# Patient Record
Sex: Male | Born: 1950
Health system: Southern US, Community
[De-identification: ages and names within clinical notes are randomized; demographics above are authoritative.]

## PROBLEM LIST (undated history)

## (undated) ENCOUNTER — Emergency Department (HOSPITAL_COMMUNITY): Admission: EM | Disposition: A | Discharge status: Home / Self Care | Payer: Medicare HMO | Source: Home / Self Care

## (undated) DIAGNOSIS — R011 Cardiac murmur, unspecified: Secondary | ICD-10-CM

## (undated) DIAGNOSIS — E1169 Type 2 diabetes mellitus with other specified complication: Secondary | ICD-10-CM

## (undated) DIAGNOSIS — Z6824 Body mass index (BMI) 24.0-24.9, adult: Secondary | ICD-10-CM

## (undated) DIAGNOSIS — N189 Chronic kidney disease, unspecified: Secondary | ICD-10-CM

## (undated) DIAGNOSIS — I639 Cerebral infarction, unspecified: Secondary | ICD-10-CM

## (undated) DIAGNOSIS — E785 Hyperlipidemia, unspecified: Secondary | ICD-10-CM

## (undated) DIAGNOSIS — R0902 Hypoxemia: Secondary | ICD-10-CM

## (undated) DIAGNOSIS — E119 Type 2 diabetes mellitus without complications: Secondary | ICD-10-CM

## (undated) DIAGNOSIS — I10 Essential (primary) hypertension: Secondary | ICD-10-CM

## (undated) HISTORY — DX: Hyperlipidemia, unspecified: E78.5

## (undated) HISTORY — DX: Body mass index (BMI) 24.0-24.9, adult: Z68.24

## (undated) HISTORY — DX: Hypoxemia: R09.02

## (undated) HISTORY — PX: SHOULDER ARTHROSCOPY: SHX128

## (undated) HISTORY — DX: Type 2 diabetes mellitus with other specified complication: E11.69

---

## 2004-07-04 ENCOUNTER — Ambulatory Visit: Payer: Self-pay | Admitting: Family Medicine

## 2006-02-17 ENCOUNTER — Ambulatory Visit: Payer: Self-pay | Admitting: Gastroenterology

## 2006-05-14 IMAGING — US ABDOMEN ULTRASOUND
1 series · 17 of 25 positions shown · non-contrast
Comparison: none

REASON FOR EXAM: RUQ pain
COMMENTS:

[Series 1: abdomen ultrasound · 17 of 74 slices shown]
[im 1/74]
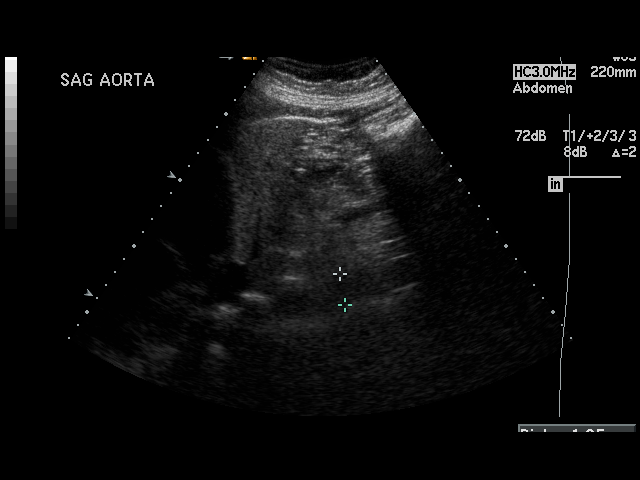
[im 7/74]
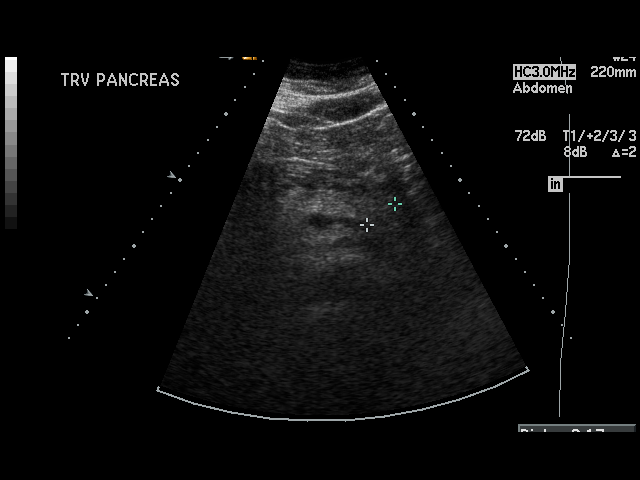
[im 10/74]
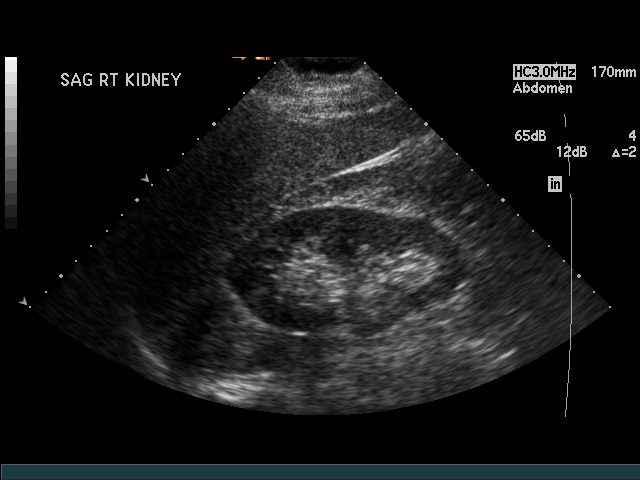
[im 16/74]
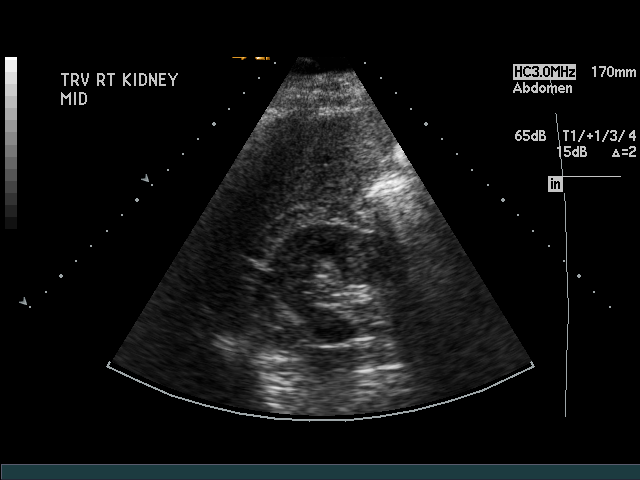
[im 19/74]
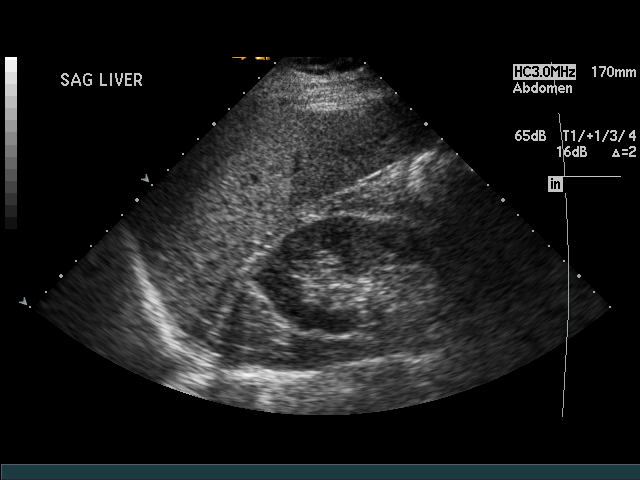
[im 25/74]
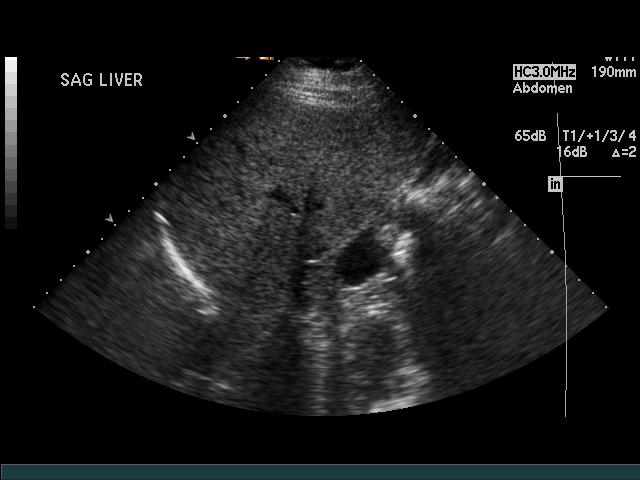
[im 28/74]
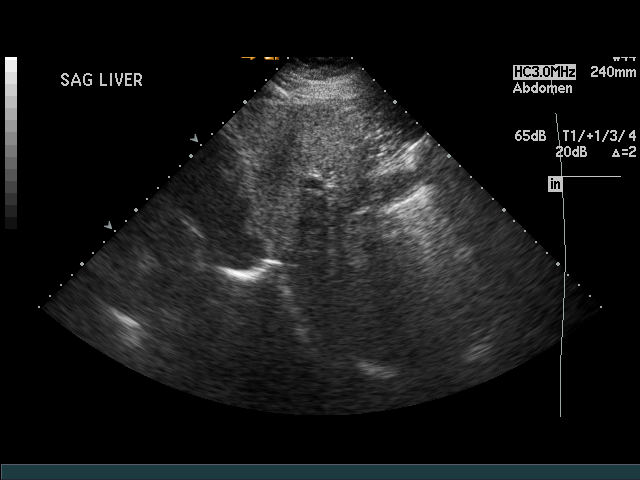
[im 34/74]
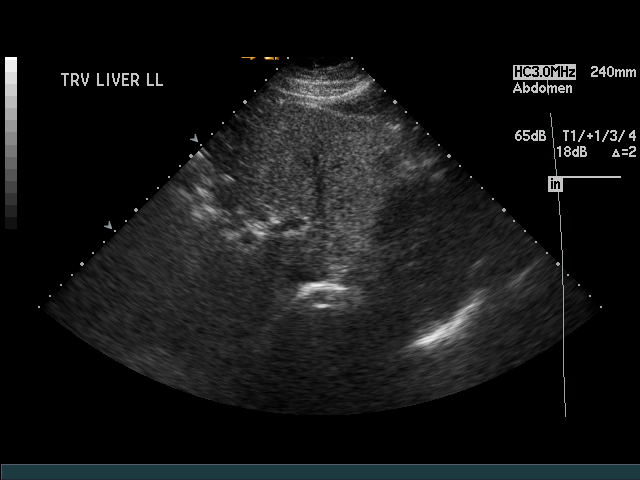
[im 37/74]
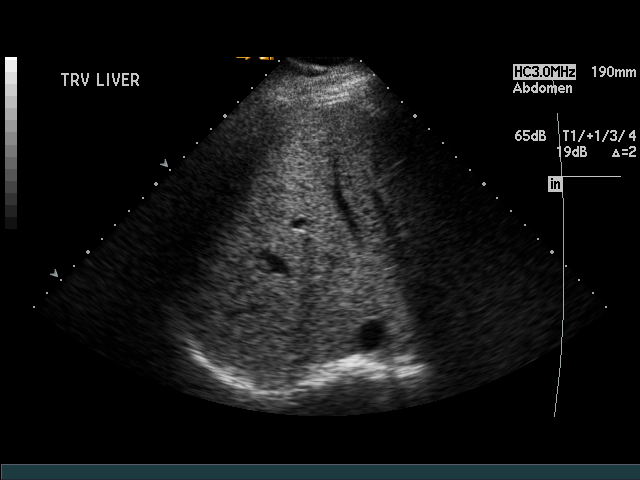
[im 40/74]
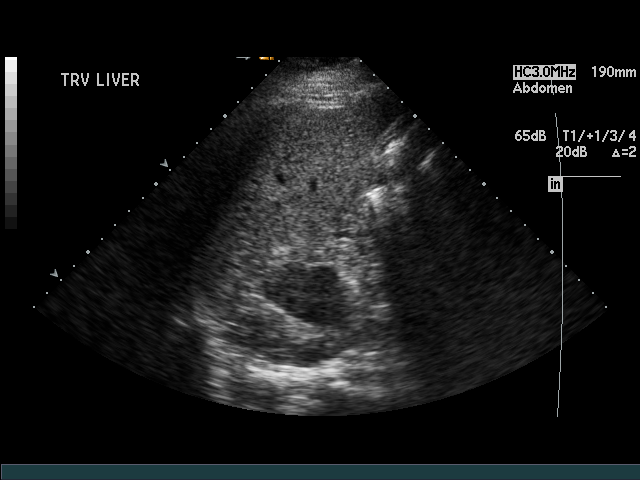
[im 46/74]
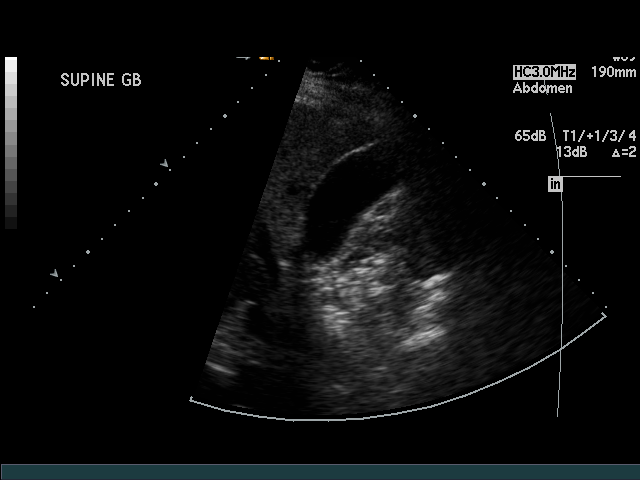
[im 49/74]
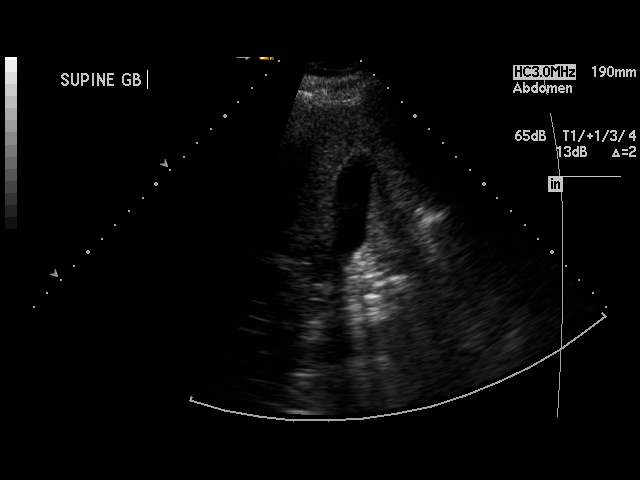
[im 55/74]
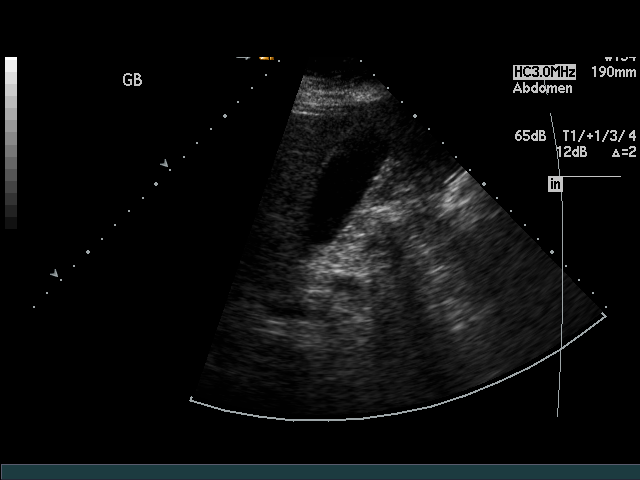
[im 58/74]
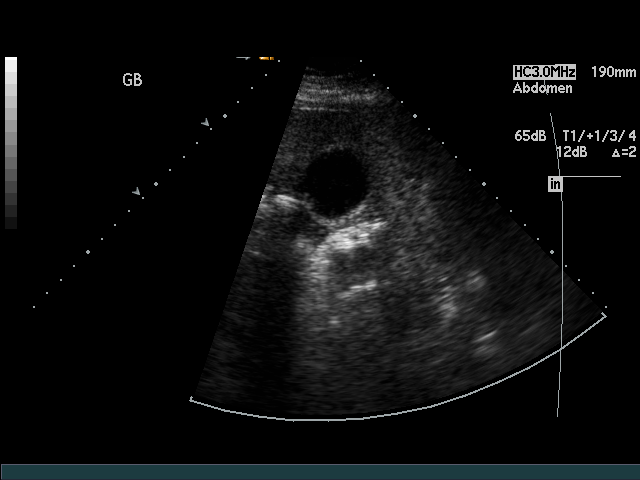
[im 64/74]
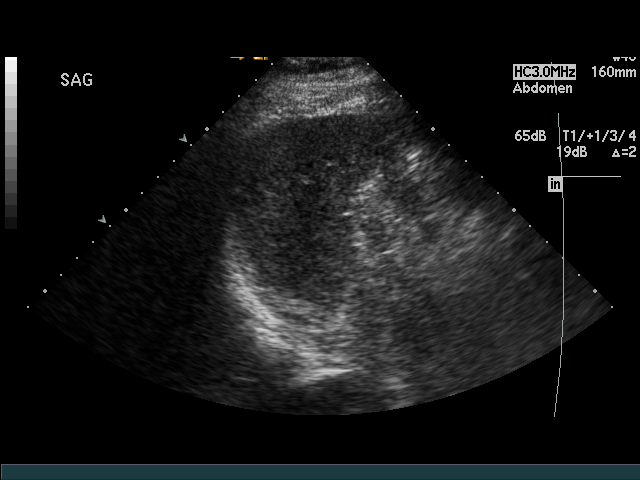
[im 67/74]
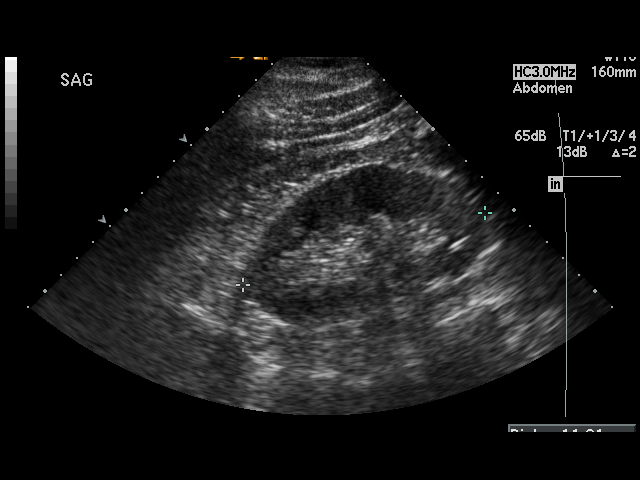
[im 74/74]
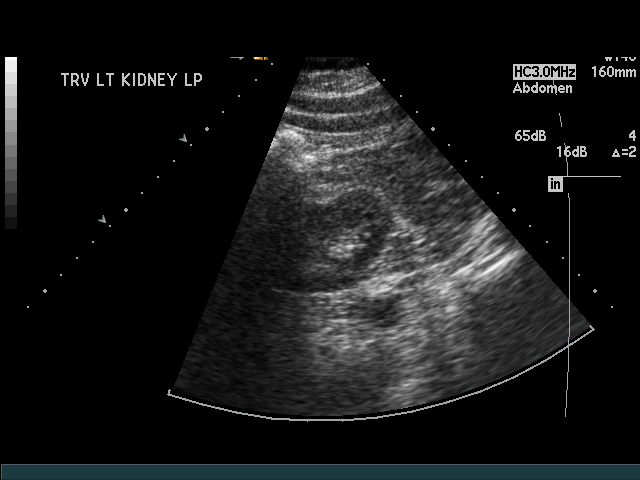

[17 of 25 positions shown; findings below may reference images not displayed]

PROCEDURE:     US  - US ABDOMEN GENERAL SURVEY  - July 04, 2004  [DATE]

RESULT:       Sonographic evaluation of the abdomen is performed in the
standard fashion.  The liver suggest slightly increased echotexture which
may be secondary to mild diffuse fatty infiltration.  The gallbladder is
fluid filled with a common bile duct diameter of 4 mm.  The pancreas is only
partially distributed.  The spleen, kidneys and liver are otherwise normal.
There is no pericholecystic fluid or ascites evident.
IMPRESSION: 1.     Portions of the pancreas were obscured by overlying bowel gas.
2.     No evidence of cholelithiasis.
3.     Otherwise unremarkable abdominal sonogram with mild increased
echogenicity of the liver which could represent diffuse fatty infiltration.

## 2006-11-11 ENCOUNTER — Ambulatory Visit: Payer: Self-pay | Admitting: Family Medicine

## 2006-11-27 ENCOUNTER — Ambulatory Visit: Payer: Self-pay | Admitting: Family Medicine

## 2008-09-20 IMAGING — US US CAROTID DUPLEX BILAT
1 series · 17 of 24 positions shown · non-contrast
Comparison: none

REASON FOR EXAM: TIA      heart murmur
COMMENTS:

[Series 1: us carotid duplex bilat · 17 of 56 slices shown]
[im 1/56]
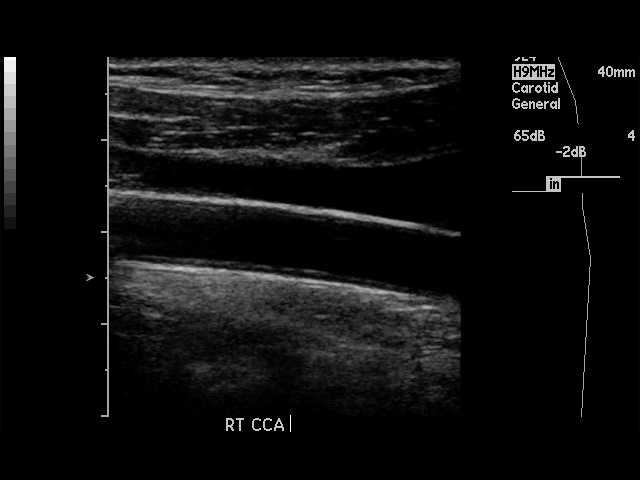
[im 5/56]
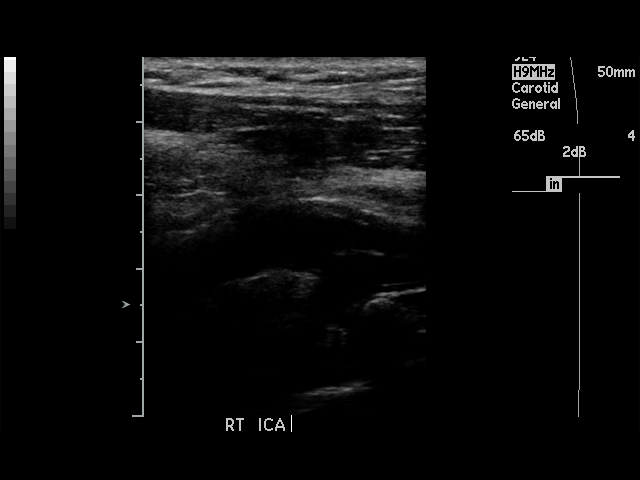
[im 8/56]
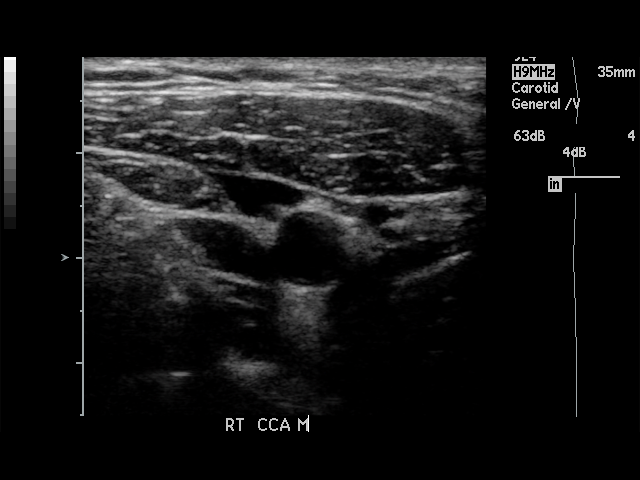
[im 10/56]
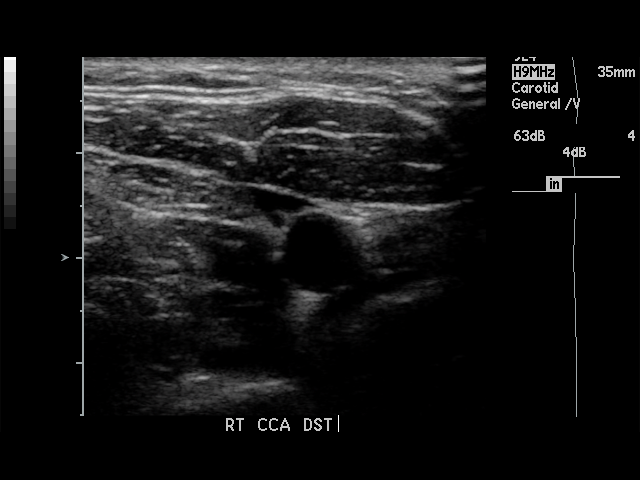
[im 15/56]
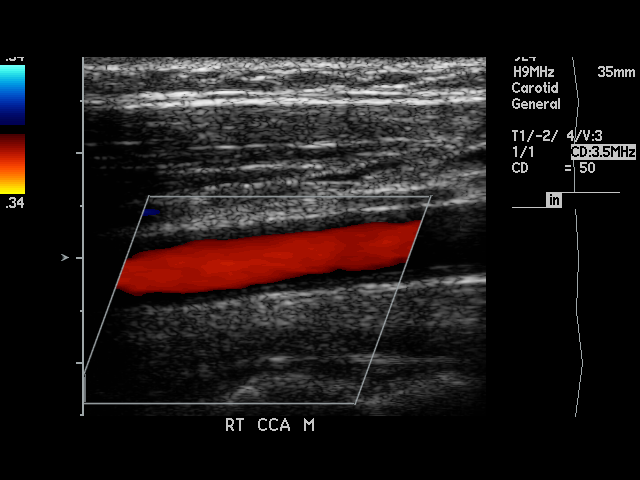
[im 17/56]
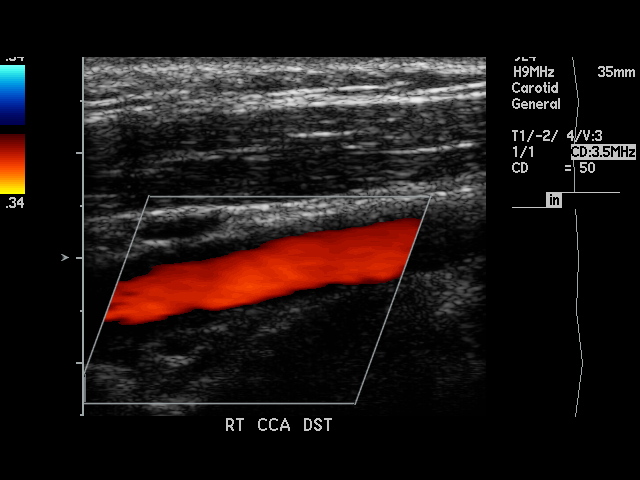
[im 22/56]
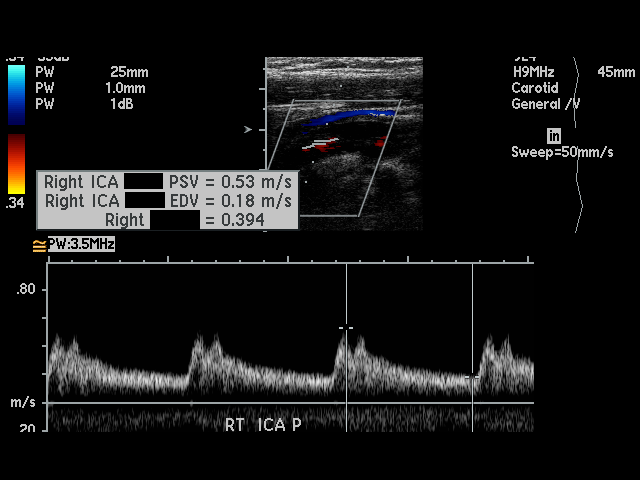
[im 24/56]
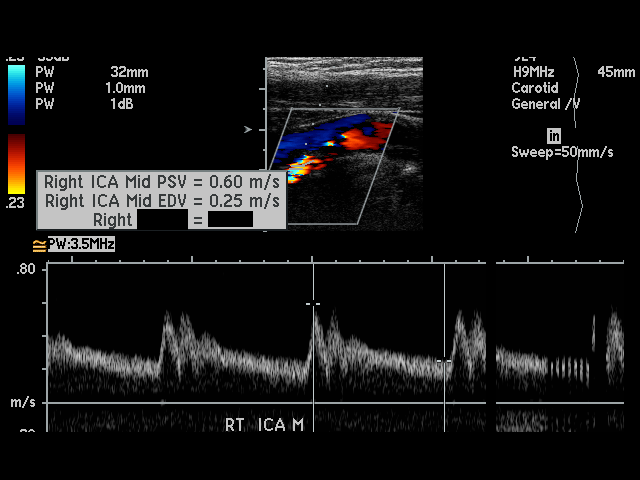
[im 29/56]
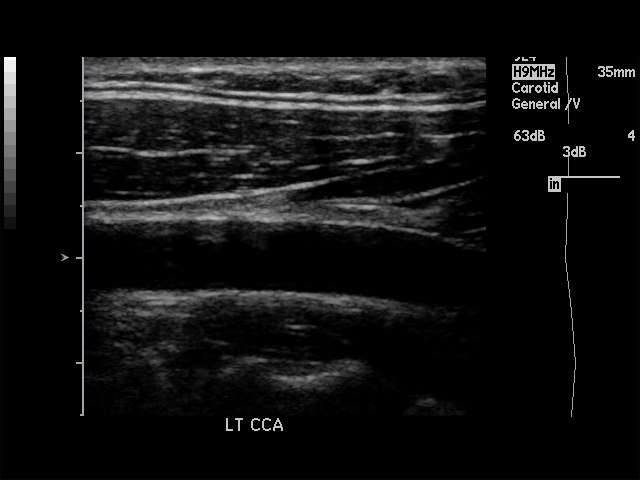
[im 32/56]
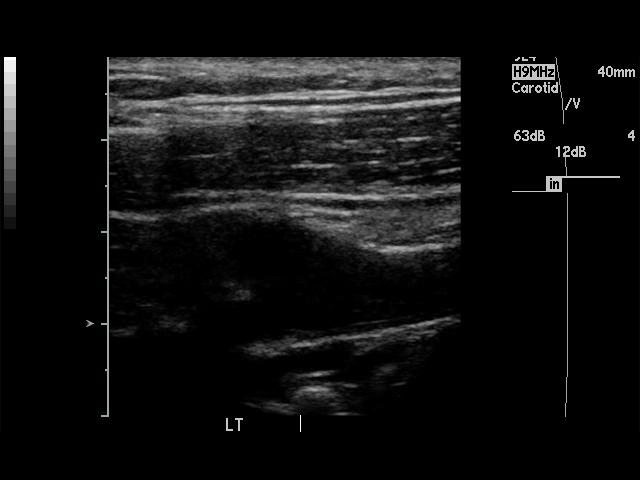
[im 34/56]
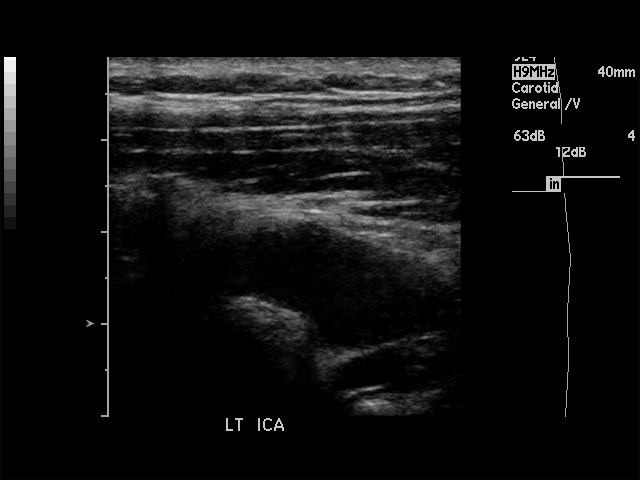
[im 39/56]
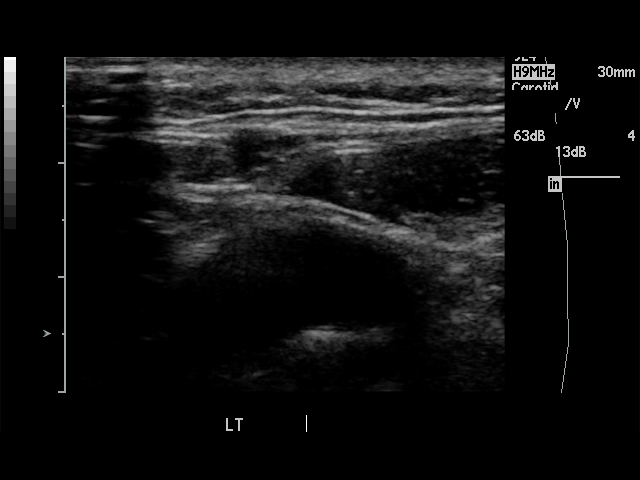
[im 41/56]
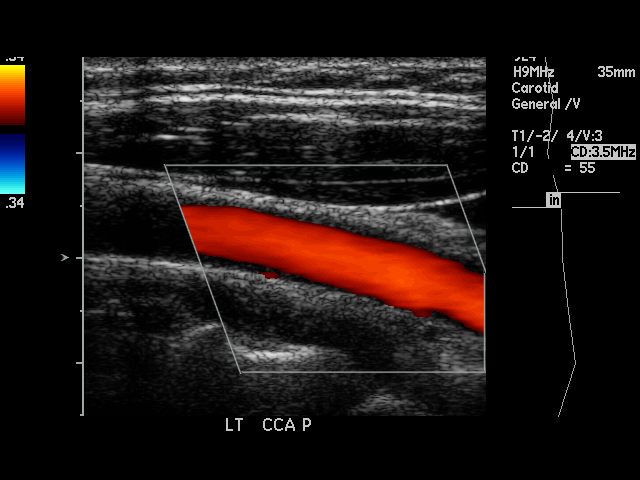
[im 46/56]
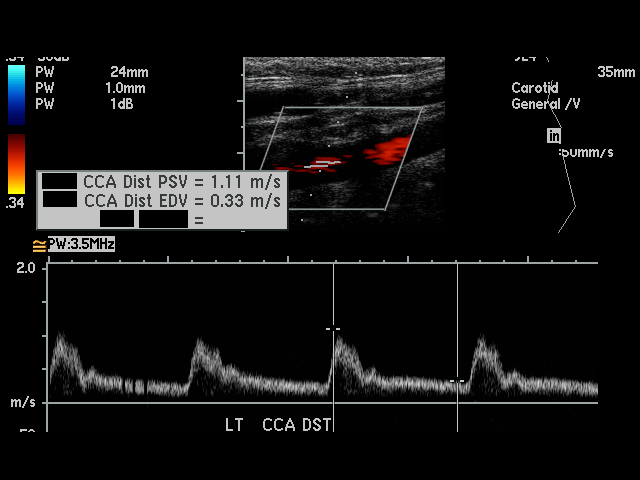
[im 48/56]
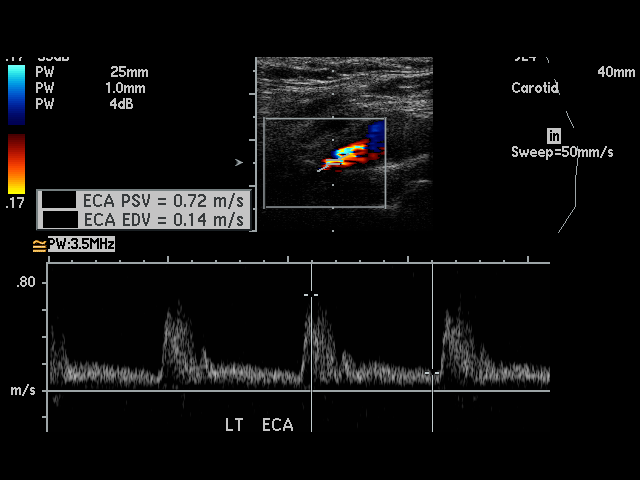
[im 51/56]
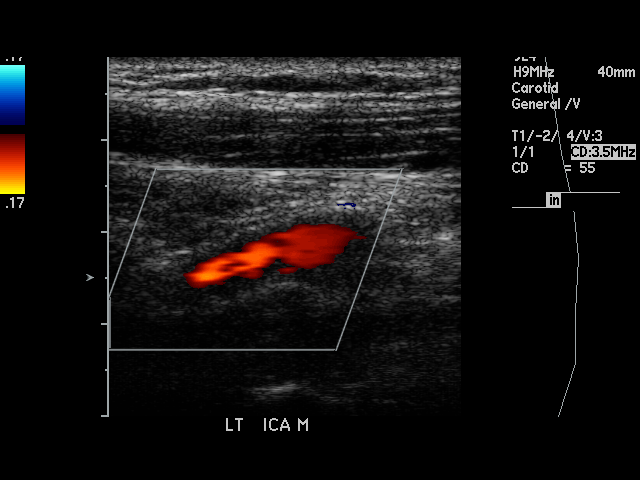
[im 56/56]
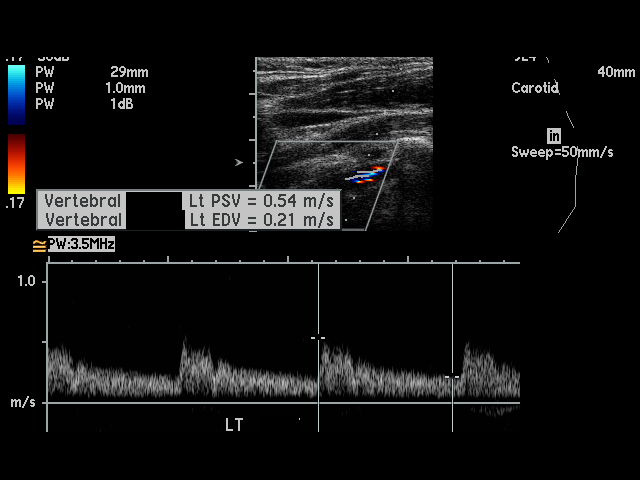

[17 of 24 positions shown; findings below may reference images not displayed]

PROCEDURE:     US  - US CAROTID DOPPLER BILATERAL  - November 11, 2006 [DATE]

RESULT:     No definite soft or calcific plaque formation is identified on
either side. On the RIGHT, the peak RIGHT common carotid artery flow
velocity measures 1.35 meters per second and the peak RIGHT internal carotid
artery flow velocity measures .60 meters per second. The IC/CC ratio is
0.44.  On the LEFT, the peak LEFT common carotid artery flow velocity
measures 1.32 meters per second and the peak LEFT internal carotid artery
flow velocity measures .82 meters per second. The IC/CC ratio is 0.61.
These values are compatible with the absence of hemodynamically significant
stenosis bilaterally.

Antegrade flow is observed in both vertebrals.
IMPRESSION: 1. Normal study. No plaque formation or stenosis is identified on either
side.
2. Antegrade flow is noted in both vertebrals.

## 2011-04-19 DIAGNOSIS — I639 Cerebral infarction, unspecified: Secondary | ICD-10-CM

## 2011-04-19 HISTORY — DX: Cerebral infarction, unspecified: I63.9

## 2014-09-13 DIAGNOSIS — E782 Mixed hyperlipidemia: Secondary | ICD-10-CM | POA: Diagnosis not present

## 2014-09-13 DIAGNOSIS — E1129 Type 2 diabetes mellitus with other diabetic kidney complication: Secondary | ICD-10-CM | POA: Diagnosis not present

## 2014-09-13 DIAGNOSIS — E1159 Type 2 diabetes mellitus with other circulatory complications: Secondary | ICD-10-CM | POA: Diagnosis not present

## 2014-09-15 DIAGNOSIS — E1159 Type 2 diabetes mellitus with other circulatory complications: Secondary | ICD-10-CM | POA: Diagnosis not present

## 2014-09-15 DIAGNOSIS — N182 Chronic kidney disease, stage 2 (mild): Secondary | ICD-10-CM | POA: Diagnosis not present

## 2014-09-15 DIAGNOSIS — I10 Essential (primary) hypertension: Secondary | ICD-10-CM | POA: Diagnosis not present

## 2014-09-15 DIAGNOSIS — E1129 Type 2 diabetes mellitus with other diabetic kidney complication: Secondary | ICD-10-CM | POA: Diagnosis not present

## 2014-10-06 DIAGNOSIS — H52223 Regular astigmatism, bilateral: Secondary | ICD-10-CM | POA: Diagnosis not present

## 2014-10-06 DIAGNOSIS — E11329 Type 2 diabetes mellitus with mild nonproliferative diabetic retinopathy without macular edema: Secondary | ICD-10-CM | POA: Diagnosis not present

## 2014-10-06 DIAGNOSIS — H5203 Hypermetropia, bilateral: Secondary | ICD-10-CM | POA: Diagnosis not present

## 2015-01-18 DIAGNOSIS — E1159 Type 2 diabetes mellitus with other circulatory complications: Secondary | ICD-10-CM | POA: Diagnosis not present

## 2015-01-18 DIAGNOSIS — E1129 Type 2 diabetes mellitus with other diabetic kidney complication: Secondary | ICD-10-CM | POA: Diagnosis not present

## 2015-01-18 DIAGNOSIS — E782 Mixed hyperlipidemia: Secondary | ICD-10-CM | POA: Diagnosis not present

## 2015-01-31 DIAGNOSIS — I10 Essential (primary) hypertension: Secondary | ICD-10-CM | POA: Diagnosis not present

## 2015-01-31 DIAGNOSIS — E1165 Type 2 diabetes mellitus with hyperglycemia: Secondary | ICD-10-CM | POA: Diagnosis not present

## 2015-01-31 DIAGNOSIS — E782 Mixed hyperlipidemia: Secondary | ICD-10-CM | POA: Diagnosis not present

## 2015-01-31 DIAGNOSIS — E1159 Type 2 diabetes mellitus with other circulatory complications: Secondary | ICD-10-CM | POA: Diagnosis not present

## 2015-02-16 DIAGNOSIS — Z0100 Encounter for examination of eyes and vision without abnormal findings: Secondary | ICD-10-CM | POA: Diagnosis not present

## 2015-02-16 DIAGNOSIS — Z0000 Encounter for general adult medical examination without abnormal findings: Secondary | ICD-10-CM | POA: Diagnosis not present

## 2015-02-16 DIAGNOSIS — Z139 Encounter for screening, unspecified: Secondary | ICD-10-CM | POA: Diagnosis not present

## 2015-02-16 DIAGNOSIS — Z23 Encounter for immunization: Secondary | ICD-10-CM | POA: Diagnosis not present

## 2015-02-16 DIAGNOSIS — Z1389 Encounter for screening for other disorder: Secondary | ICD-10-CM | POA: Diagnosis not present

## 2015-04-25 DIAGNOSIS — Z125 Encounter for screening for malignant neoplasm of prostate: Secondary | ICD-10-CM | POA: Diagnosis not present

## 2015-04-25 DIAGNOSIS — E785 Hyperlipidemia, unspecified: Secondary | ICD-10-CM | POA: Diagnosis not present

## 2015-04-25 DIAGNOSIS — E1159 Type 2 diabetes mellitus with other circulatory complications: Secondary | ICD-10-CM | POA: Diagnosis not present

## 2015-04-25 DIAGNOSIS — E1165 Type 2 diabetes mellitus with hyperglycemia: Secondary | ICD-10-CM | POA: Diagnosis not present

## 2015-05-02 DIAGNOSIS — E785 Hyperlipidemia, unspecified: Secondary | ICD-10-CM | POA: Diagnosis not present

## 2015-05-02 DIAGNOSIS — E1159 Type 2 diabetes mellitus with other circulatory complications: Secondary | ICD-10-CM | POA: Diagnosis not present

## 2015-05-02 DIAGNOSIS — E1165 Type 2 diabetes mellitus with hyperglycemia: Secondary | ICD-10-CM | POA: Diagnosis not present

## 2015-05-02 DIAGNOSIS — I10 Essential (primary) hypertension: Secondary | ICD-10-CM | POA: Diagnosis not present

## 2015-08-29 DIAGNOSIS — E1165 Type 2 diabetes mellitus with hyperglycemia: Secondary | ICD-10-CM | POA: Diagnosis not present

## 2015-08-29 DIAGNOSIS — E785 Hyperlipidemia, unspecified: Secondary | ICD-10-CM | POA: Diagnosis not present

## 2015-08-29 DIAGNOSIS — E1159 Type 2 diabetes mellitus with other circulatory complications: Secondary | ICD-10-CM | POA: Diagnosis not present

## 2015-09-14 DIAGNOSIS — E119 Type 2 diabetes mellitus without complications: Secondary | ICD-10-CM | POA: Diagnosis not present

## 2015-09-14 DIAGNOSIS — I10 Essential (primary) hypertension: Secondary | ICD-10-CM | POA: Diagnosis not present

## 2015-09-14 DIAGNOSIS — E785 Hyperlipidemia, unspecified: Secondary | ICD-10-CM | POA: Diagnosis not present

## 2015-09-14 DIAGNOSIS — E1159 Type 2 diabetes mellitus with other circulatory complications: Secondary | ICD-10-CM | POA: Diagnosis not present

## 2015-10-10 DIAGNOSIS — E113293 Type 2 diabetes mellitus with mild nonproliferative diabetic retinopathy without macular edema, bilateral: Secondary | ICD-10-CM | POA: Diagnosis not present

## 2015-10-10 DIAGNOSIS — H25813 Combined forms of age-related cataract, bilateral: Secondary | ICD-10-CM | POA: Diagnosis not present

## 2015-10-10 DIAGNOSIS — H11153 Pinguecula, bilateral: Secondary | ICD-10-CM | POA: Diagnosis not present

## 2016-01-23 DIAGNOSIS — E785 Hyperlipidemia, unspecified: Secondary | ICD-10-CM | POA: Diagnosis not present

## 2016-01-23 DIAGNOSIS — E1165 Type 2 diabetes mellitus with hyperglycemia: Secondary | ICD-10-CM | POA: Diagnosis not present

## 2016-01-23 DIAGNOSIS — E1159 Type 2 diabetes mellitus with other circulatory complications: Secondary | ICD-10-CM | POA: Diagnosis not present

## 2016-02-12 DIAGNOSIS — Z23 Encounter for immunization: Secondary | ICD-10-CM | POA: Diagnosis not present

## 2016-02-12 DIAGNOSIS — E785 Hyperlipidemia, unspecified: Secondary | ICD-10-CM | POA: Diagnosis not present

## 2016-02-12 DIAGNOSIS — E1159 Type 2 diabetes mellitus with other circulatory complications: Secondary | ICD-10-CM | POA: Diagnosis not present

## 2016-02-12 DIAGNOSIS — E1129 Type 2 diabetes mellitus with other diabetic kidney complication: Secondary | ICD-10-CM | POA: Diagnosis not present

## 2016-02-12 DIAGNOSIS — I10 Essential (primary) hypertension: Secondary | ICD-10-CM | POA: Diagnosis not present

## 2016-04-16 DIAGNOSIS — Z6829 Body mass index (BMI) 29.0-29.9, adult: Secondary | ICD-10-CM | POA: Diagnosis not present

## 2016-04-16 DIAGNOSIS — M7989 Other specified soft tissue disorders: Secondary | ICD-10-CM | POA: Diagnosis not present

## 2016-04-16 DIAGNOSIS — M25571 Pain in right ankle and joints of right foot: Secondary | ICD-10-CM | POA: Diagnosis not present

## 2016-04-16 DIAGNOSIS — M79661 Pain in right lower leg: Secondary | ICD-10-CM | POA: Diagnosis not present

## 2016-04-23 DIAGNOSIS — Z0000 Encounter for general adult medical examination without abnormal findings: Secondary | ICD-10-CM | POA: Diagnosis not present

## 2016-04-23 DIAGNOSIS — M25571 Pain in right ankle and joints of right foot: Secondary | ICD-10-CM | POA: Diagnosis not present

## 2016-04-23 DIAGNOSIS — Z139 Encounter for screening, unspecified: Secondary | ICD-10-CM | POA: Diagnosis not present

## 2016-04-23 DIAGNOSIS — Z1389 Encounter for screening for other disorder: Secondary | ICD-10-CM | POA: Diagnosis not present

## 2016-04-24 DIAGNOSIS — M25571 Pain in right ankle and joints of right foot: Secondary | ICD-10-CM | POA: Diagnosis not present

## 2016-05-14 DIAGNOSIS — M25571 Pain in right ankle and joints of right foot: Secondary | ICD-10-CM | POA: Diagnosis not present

## 2016-05-14 DIAGNOSIS — Z6826 Body mass index (BMI) 26.0-26.9, adult: Secondary | ICD-10-CM | POA: Diagnosis not present

## 2016-05-14 DIAGNOSIS — M25562 Pain in left knee: Secondary | ICD-10-CM | POA: Diagnosis not present

## 2016-05-16 DIAGNOSIS — M25562 Pain in left knee: Secondary | ICD-10-CM | POA: Diagnosis not present

## 2016-05-16 DIAGNOSIS — M1712 Unilateral primary osteoarthritis, left knee: Secondary | ICD-10-CM | POA: Diagnosis not present

## 2016-05-16 DIAGNOSIS — M25462 Effusion, left knee: Secondary | ICD-10-CM | POA: Diagnosis not present

## 2016-05-17 DIAGNOSIS — M25462 Effusion, left knee: Secondary | ICD-10-CM | POA: Diagnosis not present

## 2016-05-28 DIAGNOSIS — E1129 Type 2 diabetes mellitus with other diabetic kidney complication: Secondary | ICD-10-CM | POA: Diagnosis not present

## 2016-05-28 DIAGNOSIS — E785 Hyperlipidemia, unspecified: Secondary | ICD-10-CM | POA: Diagnosis not present

## 2016-05-28 DIAGNOSIS — E1159 Type 2 diabetes mellitus with other circulatory complications: Secondary | ICD-10-CM | POA: Diagnosis not present

## 2016-06-11 DIAGNOSIS — E1159 Type 2 diabetes mellitus with other circulatory complications: Secondary | ICD-10-CM | POA: Diagnosis not present

## 2016-06-11 DIAGNOSIS — E1129 Type 2 diabetes mellitus with other diabetic kidney complication: Secondary | ICD-10-CM | POA: Diagnosis not present

## 2016-06-11 DIAGNOSIS — E782 Mixed hyperlipidemia: Secondary | ICD-10-CM | POA: Diagnosis not present

## 2016-06-11 DIAGNOSIS — I10 Essential (primary) hypertension: Secondary | ICD-10-CM | POA: Diagnosis not present

## 2016-06-20 DIAGNOSIS — M109 Gout, unspecified: Secondary | ICD-10-CM | POA: Diagnosis not present

## 2016-09-17 DIAGNOSIS — E1159 Type 2 diabetes mellitus with other circulatory complications: Secondary | ICD-10-CM | POA: Diagnosis not present

## 2016-09-17 DIAGNOSIS — E785 Hyperlipidemia, unspecified: Secondary | ICD-10-CM | POA: Diagnosis not present

## 2016-09-17 DIAGNOSIS — E1129 Type 2 diabetes mellitus with other diabetic kidney complication: Secondary | ICD-10-CM | POA: Diagnosis not present

## 2016-09-30 DIAGNOSIS — E785 Hyperlipidemia, unspecified: Secondary | ICD-10-CM | POA: Diagnosis not present

## 2016-09-30 DIAGNOSIS — E1129 Type 2 diabetes mellitus with other diabetic kidney complication: Secondary | ICD-10-CM | POA: Diagnosis not present

## 2016-09-30 DIAGNOSIS — E1159 Type 2 diabetes mellitus with other circulatory complications: Secondary | ICD-10-CM | POA: Diagnosis not present

## 2016-09-30 DIAGNOSIS — I10 Essential (primary) hypertension: Secondary | ICD-10-CM | POA: Diagnosis not present

## 2016-10-16 DIAGNOSIS — M109 Gout, unspecified: Secondary | ICD-10-CM | POA: Diagnosis not present

## 2016-10-16 DIAGNOSIS — Z6825 Body mass index (BMI) 25.0-25.9, adult: Secondary | ICD-10-CM | POA: Diagnosis not present

## 2017-01-24 DIAGNOSIS — E1159 Type 2 diabetes mellitus with other circulatory complications: Secondary | ICD-10-CM | POA: Diagnosis not present

## 2017-01-24 DIAGNOSIS — E1129 Type 2 diabetes mellitus with other diabetic kidney complication: Secondary | ICD-10-CM | POA: Diagnosis not present

## 2017-01-24 DIAGNOSIS — E785 Hyperlipidemia, unspecified: Secondary | ICD-10-CM | POA: Diagnosis not present

## 2017-01-30 DIAGNOSIS — E113293 Type 2 diabetes mellitus with mild nonproliferative diabetic retinopathy without macular edema, bilateral: Secondary | ICD-10-CM | POA: Diagnosis not present

## 2017-01-30 DIAGNOSIS — Z0100 Encounter for examination of eyes and vision without abnormal findings: Secondary | ICD-10-CM | POA: Diagnosis not present

## 2017-01-30 DIAGNOSIS — H25813 Combined forms of age-related cataract, bilateral: Secondary | ICD-10-CM | POA: Diagnosis not present

## 2017-01-31 DIAGNOSIS — E1129 Type 2 diabetes mellitus with other diabetic kidney complication: Secondary | ICD-10-CM | POA: Diagnosis not present

## 2017-01-31 DIAGNOSIS — Z139 Encounter for screening, unspecified: Secondary | ICD-10-CM | POA: Diagnosis not present

## 2017-01-31 DIAGNOSIS — Z23 Encounter for immunization: Secondary | ICD-10-CM | POA: Diagnosis not present

## 2017-01-31 DIAGNOSIS — E785 Hyperlipidemia, unspecified: Secondary | ICD-10-CM | POA: Diagnosis not present

## 2017-01-31 DIAGNOSIS — I10 Essential (primary) hypertension: Secondary | ICD-10-CM | POA: Diagnosis not present

## 2017-01-31 DIAGNOSIS — E1159 Type 2 diabetes mellitus with other circulatory complications: Secondary | ICD-10-CM | POA: Diagnosis not present

## 2017-04-22 DIAGNOSIS — Z1331 Encounter for screening for depression: Secondary | ICD-10-CM | POA: Diagnosis not present

## 2017-04-22 DIAGNOSIS — Z9181 History of falling: Secondary | ICD-10-CM | POA: Diagnosis not present

## 2017-04-22 DIAGNOSIS — Z139 Encounter for screening, unspecified: Secondary | ICD-10-CM | POA: Diagnosis not present

## 2017-04-22 DIAGNOSIS — Z0000 Encounter for general adult medical examination without abnormal findings: Secondary | ICD-10-CM | POA: Diagnosis not present

## 2017-05-27 DIAGNOSIS — E1159 Type 2 diabetes mellitus with other circulatory complications: Secondary | ICD-10-CM | POA: Diagnosis not present

## 2017-05-27 DIAGNOSIS — E785 Hyperlipidemia, unspecified: Secondary | ICD-10-CM | POA: Diagnosis not present

## 2017-05-27 DIAGNOSIS — E1129 Type 2 diabetes mellitus with other diabetic kidney complication: Secondary | ICD-10-CM | POA: Diagnosis not present

## 2017-06-04 DIAGNOSIS — I10 Essential (primary) hypertension: Secondary | ICD-10-CM | POA: Diagnosis not present

## 2017-06-04 DIAGNOSIS — E1159 Type 2 diabetes mellitus with other circulatory complications: Secondary | ICD-10-CM | POA: Diagnosis not present

## 2017-06-04 DIAGNOSIS — E1129 Type 2 diabetes mellitus with other diabetic kidney complication: Secondary | ICD-10-CM | POA: Diagnosis not present

## 2017-06-04 DIAGNOSIS — E785 Hyperlipidemia, unspecified: Secondary | ICD-10-CM | POA: Diagnosis not present

## 2017-06-23 DIAGNOSIS — Z1211 Encounter for screening for malignant neoplasm of colon: Secondary | ICD-10-CM | POA: Diagnosis not present

## 2017-06-23 DIAGNOSIS — K648 Other hemorrhoids: Secondary | ICD-10-CM | POA: Diagnosis not present

## 2017-06-23 DIAGNOSIS — Z01818 Encounter for other preprocedural examination: Secondary | ICD-10-CM | POA: Diagnosis not present

## 2017-07-11 DIAGNOSIS — E119 Type 2 diabetes mellitus without complications: Secondary | ICD-10-CM | POA: Diagnosis not present

## 2017-07-11 DIAGNOSIS — Z79899 Other long term (current) drug therapy: Secondary | ICD-10-CM | POA: Diagnosis not present

## 2017-07-11 DIAGNOSIS — Z1211 Encounter for screening for malignant neoplasm of colon: Secondary | ICD-10-CM | POA: Diagnosis not present

## 2017-07-11 DIAGNOSIS — I10 Essential (primary) hypertension: Secondary | ICD-10-CM | POA: Diagnosis not present

## 2017-07-11 DIAGNOSIS — Z7984 Long term (current) use of oral hypoglycemic drugs: Secondary | ICD-10-CM | POA: Diagnosis not present

## 2017-08-20 DIAGNOSIS — Z6826 Body mass index (BMI) 26.0-26.9, adult: Secondary | ICD-10-CM | POA: Diagnosis not present

## 2017-08-20 DIAGNOSIS — B070 Plantar wart: Secondary | ICD-10-CM | POA: Diagnosis not present

## 2017-09-05 DIAGNOSIS — Z6825 Body mass index (BMI) 25.0-25.9, adult: Secondary | ICD-10-CM | POA: Diagnosis not present

## 2017-09-05 DIAGNOSIS — L259 Unspecified contact dermatitis, unspecified cause: Secondary | ICD-10-CM | POA: Diagnosis not present

## 2017-09-16 DIAGNOSIS — E1159 Type 2 diabetes mellitus with other circulatory complications: Secondary | ICD-10-CM | POA: Diagnosis not present

## 2017-09-16 DIAGNOSIS — E1129 Type 2 diabetes mellitus with other diabetic kidney complication: Secondary | ICD-10-CM | POA: Diagnosis not present

## 2017-09-16 DIAGNOSIS — E785 Hyperlipidemia, unspecified: Secondary | ICD-10-CM | POA: Diagnosis not present

## 2017-09-29 DIAGNOSIS — E785 Hyperlipidemia, unspecified: Secondary | ICD-10-CM | POA: Diagnosis not present

## 2017-09-29 DIAGNOSIS — I10 Essential (primary) hypertension: Secondary | ICD-10-CM | POA: Diagnosis not present

## 2017-09-29 DIAGNOSIS — M25512 Pain in left shoulder: Secondary | ICD-10-CM | POA: Diagnosis not present

## 2017-09-29 DIAGNOSIS — E1129 Type 2 diabetes mellitus with other diabetic kidney complication: Secondary | ICD-10-CM | POA: Diagnosis not present

## 2017-09-29 DIAGNOSIS — S4992XA Unspecified injury of left shoulder and upper arm, initial encounter: Secondary | ICD-10-CM | POA: Diagnosis not present

## 2017-10-08 DIAGNOSIS — Z139 Encounter for screening, unspecified: Secondary | ICD-10-CM | POA: Diagnosis not present

## 2017-10-08 DIAGNOSIS — M25512 Pain in left shoulder: Secondary | ICD-10-CM | POA: Diagnosis not present

## 2017-10-08 DIAGNOSIS — Z6825 Body mass index (BMI) 25.0-25.9, adult: Secondary | ICD-10-CM | POA: Diagnosis not present

## 2017-10-15 DIAGNOSIS — M7542 Impingement syndrome of left shoulder: Secondary | ICD-10-CM | POA: Diagnosis not present

## 2017-11-12 DIAGNOSIS — M7542 Impingement syndrome of left shoulder: Secondary | ICD-10-CM

## 2017-11-12 DIAGNOSIS — S46912D Strain of unspecified muscle, fascia and tendon at shoulder and upper arm level, left arm, subsequent encounter: Secondary | ICD-10-CM | POA: Diagnosis not present

## 2017-11-12 HISTORY — DX: Impingement syndrome of left shoulder: M75.42

## 2017-11-25 DIAGNOSIS — M25512 Pain in left shoulder: Secondary | ICD-10-CM | POA: Diagnosis not present

## 2017-11-28 DIAGNOSIS — S46912D Strain of unspecified muscle, fascia and tendon at shoulder and upper arm level, left arm, subsequent encounter: Secondary | ICD-10-CM | POA: Diagnosis not present

## 2017-12-03 DIAGNOSIS — Z139 Encounter for screening, unspecified: Secondary | ICD-10-CM | POA: Diagnosis not present

## 2017-12-03 DIAGNOSIS — S81819A Laceration without foreign body, unspecified lower leg, initial encounter: Secondary | ICD-10-CM | POA: Diagnosis not present

## 2017-12-03 DIAGNOSIS — Z6825 Body mass index (BMI) 25.0-25.9, adult: Secondary | ICD-10-CM | POA: Diagnosis not present

## 2017-12-03 DIAGNOSIS — T148XXA Other injury of unspecified body region, initial encounter: Secondary | ICD-10-CM | POA: Diagnosis not present

## 2017-12-23 DIAGNOSIS — M7502 Adhesive capsulitis of left shoulder: Secondary | ICD-10-CM | POA: Diagnosis not present

## 2017-12-23 DIAGNOSIS — S43432A Superior glenoid labrum lesion of left shoulder, initial encounter: Secondary | ICD-10-CM | POA: Diagnosis not present

## 2017-12-23 DIAGNOSIS — M7522 Bicipital tendinitis, left shoulder: Secondary | ICD-10-CM | POA: Diagnosis not present

## 2017-12-23 DIAGNOSIS — M24112 Other articular cartilage disorders, left shoulder: Secondary | ICD-10-CM | POA: Diagnosis not present

## 2017-12-23 DIAGNOSIS — M19012 Primary osteoarthritis, left shoulder: Secondary | ICD-10-CM | POA: Diagnosis not present

## 2017-12-23 DIAGNOSIS — G8918 Other acute postprocedural pain: Secondary | ICD-10-CM | POA: Diagnosis not present

## 2017-12-23 DIAGNOSIS — M659 Synovitis and tenosynovitis, unspecified: Secondary | ICD-10-CM | POA: Diagnosis not present

## 2017-12-23 DIAGNOSIS — M75122 Complete rotator cuff tear or rupture of left shoulder, not specified as traumatic: Secondary | ICD-10-CM | POA: Diagnosis not present

## 2017-12-23 DIAGNOSIS — M7542 Impingement syndrome of left shoulder: Secondary | ICD-10-CM | POA: Diagnosis not present

## 2017-12-23 DIAGNOSIS — M65812 Other synovitis and tenosynovitis, left shoulder: Secondary | ICD-10-CM | POA: Diagnosis not present

## 2017-12-23 DIAGNOSIS — S46212A Strain of muscle, fascia and tendon of other parts of biceps, left arm, initial encounter: Secondary | ICD-10-CM | POA: Diagnosis not present

## 2017-12-31 DIAGNOSIS — M25612 Stiffness of left shoulder, not elsewhere classified: Secondary | ICD-10-CM | POA: Diagnosis not present

## 2018-01-07 DIAGNOSIS — M25612 Stiffness of left shoulder, not elsewhere classified: Secondary | ICD-10-CM | POA: Diagnosis not present

## 2018-01-14 DIAGNOSIS — M25612 Stiffness of left shoulder, not elsewhere classified: Secondary | ICD-10-CM | POA: Diagnosis not present

## 2018-01-21 DIAGNOSIS — M25612 Stiffness of left shoulder, not elsewhere classified: Secondary | ICD-10-CM | POA: Diagnosis not present

## 2018-01-28 DIAGNOSIS — M25612 Stiffness of left shoulder, not elsewhere classified: Secondary | ICD-10-CM | POA: Diagnosis not present

## 2018-01-30 DIAGNOSIS — E1129 Type 2 diabetes mellitus with other diabetic kidney complication: Secondary | ICD-10-CM | POA: Diagnosis not present

## 2018-01-30 DIAGNOSIS — E785 Hyperlipidemia, unspecified: Secondary | ICD-10-CM | POA: Diagnosis not present

## 2018-01-30 DIAGNOSIS — E1159 Type 2 diabetes mellitus with other circulatory complications: Secondary | ICD-10-CM | POA: Diagnosis not present

## 2018-02-03 DIAGNOSIS — M25612 Stiffness of left shoulder, not elsewhere classified: Secondary | ICD-10-CM | POA: Diagnosis not present

## 2018-02-06 DIAGNOSIS — Z23 Encounter for immunization: Secondary | ICD-10-CM | POA: Diagnosis not present

## 2018-02-06 DIAGNOSIS — M25612 Stiffness of left shoulder, not elsewhere classified: Secondary | ICD-10-CM | POA: Diagnosis not present

## 2018-02-06 DIAGNOSIS — E1129 Type 2 diabetes mellitus with other diabetic kidney complication: Secondary | ICD-10-CM | POA: Diagnosis not present

## 2018-02-06 DIAGNOSIS — Z0000 Encounter for general adult medical examination without abnormal findings: Secondary | ICD-10-CM | POA: Diagnosis not present

## 2018-02-06 DIAGNOSIS — E1159 Type 2 diabetes mellitus with other circulatory complications: Secondary | ICD-10-CM | POA: Diagnosis not present

## 2018-02-06 DIAGNOSIS — E785 Hyperlipidemia, unspecified: Secondary | ICD-10-CM | POA: Diagnosis not present

## 2018-02-06 DIAGNOSIS — I10 Essential (primary) hypertension: Secondary | ICD-10-CM | POA: Diagnosis not present

## 2018-02-10 DIAGNOSIS — M25612 Stiffness of left shoulder, not elsewhere classified: Secondary | ICD-10-CM | POA: Diagnosis not present

## 2018-02-13 DIAGNOSIS — M25612 Stiffness of left shoulder, not elsewhere classified: Secondary | ICD-10-CM | POA: Diagnosis not present

## 2018-02-17 DIAGNOSIS — M25612 Stiffness of left shoulder, not elsewhere classified: Secondary | ICD-10-CM | POA: Diagnosis not present

## 2018-02-20 DIAGNOSIS — M25612 Stiffness of left shoulder, not elsewhere classified: Secondary | ICD-10-CM | POA: Diagnosis not present

## 2018-02-23 DIAGNOSIS — M25612 Stiffness of left shoulder, not elsewhere classified: Secondary | ICD-10-CM | POA: Diagnosis not present

## 2018-02-25 DIAGNOSIS — M25612 Stiffness of left shoulder, not elsewhere classified: Secondary | ICD-10-CM | POA: Diagnosis not present

## 2018-03-04 DIAGNOSIS — M25612 Stiffness of left shoulder, not elsewhere classified: Secondary | ICD-10-CM | POA: Diagnosis not present

## 2018-03-06 DIAGNOSIS — M25612 Stiffness of left shoulder, not elsewhere classified: Secondary | ICD-10-CM | POA: Diagnosis not present

## 2018-03-09 DIAGNOSIS — M25612 Stiffness of left shoulder, not elsewhere classified: Secondary | ICD-10-CM | POA: Diagnosis not present

## 2018-03-11 DIAGNOSIS — I10 Essential (primary) hypertension: Secondary | ICD-10-CM | POA: Diagnosis not present

## 2018-03-11 DIAGNOSIS — E1159 Type 2 diabetes mellitus with other circulatory complications: Secondary | ICD-10-CM | POA: Diagnosis not present

## 2018-03-11 DIAGNOSIS — Z6825 Body mass index (BMI) 25.0-25.9, adult: Secondary | ICD-10-CM | POA: Diagnosis not present

## 2018-03-11 DIAGNOSIS — T7840XA Allergy, unspecified, initial encounter: Secondary | ICD-10-CM | POA: Diagnosis not present

## 2018-03-12 DIAGNOSIS — M25612 Stiffness of left shoulder, not elsewhere classified: Secondary | ICD-10-CM | POA: Diagnosis not present

## 2018-03-16 DIAGNOSIS — L239 Allergic contact dermatitis, unspecified cause: Secondary | ICD-10-CM | POA: Diagnosis not present

## 2018-03-16 DIAGNOSIS — Z6824 Body mass index (BMI) 24.0-24.9, adult: Secondary | ICD-10-CM | POA: Diagnosis not present

## 2018-03-16 DIAGNOSIS — M25612 Stiffness of left shoulder, not elsewhere classified: Secondary | ICD-10-CM | POA: Diagnosis not present

## 2018-03-19 DIAGNOSIS — M25612 Stiffness of left shoulder, not elsewhere classified: Secondary | ICD-10-CM | POA: Diagnosis not present

## 2018-03-24 DIAGNOSIS — M25612 Stiffness of left shoulder, not elsewhere classified: Secondary | ICD-10-CM | POA: Diagnosis not present

## 2018-04-02 DIAGNOSIS — M25612 Stiffness of left shoulder, not elsewhere classified: Secondary | ICD-10-CM | POA: Diagnosis not present

## 2018-04-07 DIAGNOSIS — M25612 Stiffness of left shoulder, not elsewhere classified: Secondary | ICD-10-CM | POA: Diagnosis not present

## 2018-04-14 DIAGNOSIS — M25612 Stiffness of left shoulder, not elsewhere classified: Secondary | ICD-10-CM | POA: Diagnosis not present

## 2018-04-16 DIAGNOSIS — M25612 Stiffness of left shoulder, not elsewhere classified: Secondary | ICD-10-CM | POA: Diagnosis not present

## 2018-04-20 DIAGNOSIS — H25813 Combined forms of age-related cataract, bilateral: Secondary | ICD-10-CM | POA: Diagnosis not present

## 2018-04-20 DIAGNOSIS — E119 Type 2 diabetes mellitus without complications: Secondary | ICD-10-CM | POA: Diagnosis not present

## 2018-04-21 DIAGNOSIS — M25612 Stiffness of left shoulder, not elsewhere classified: Secondary | ICD-10-CM | POA: Diagnosis not present

## 2018-04-24 DIAGNOSIS — M25612 Stiffness of left shoulder, not elsewhere classified: Secondary | ICD-10-CM | POA: Diagnosis not present

## 2018-04-28 DIAGNOSIS — M25612 Stiffness of left shoulder, not elsewhere classified: Secondary | ICD-10-CM | POA: Diagnosis not present

## 2018-05-01 DIAGNOSIS — M25612 Stiffness of left shoulder, not elsewhere classified: Secondary | ICD-10-CM | POA: Diagnosis not present

## 2018-05-05 DIAGNOSIS — M25612 Stiffness of left shoulder, not elsewhere classified: Secondary | ICD-10-CM | POA: Diagnosis not present

## 2018-05-08 DIAGNOSIS — M25612 Stiffness of left shoulder, not elsewhere classified: Secondary | ICD-10-CM | POA: Diagnosis not present

## 2018-05-12 DIAGNOSIS — M25612 Stiffness of left shoulder, not elsewhere classified: Secondary | ICD-10-CM | POA: Diagnosis not present

## 2018-05-15 DIAGNOSIS — M25612 Stiffness of left shoulder, not elsewhere classified: Secondary | ICD-10-CM | POA: Diagnosis not present

## 2018-05-19 DIAGNOSIS — M25612 Stiffness of left shoulder, not elsewhere classified: Secondary | ICD-10-CM | POA: Diagnosis not present

## 2018-05-22 DIAGNOSIS — M25612 Stiffness of left shoulder, not elsewhere classified: Secondary | ICD-10-CM | POA: Diagnosis not present

## 2018-05-26 DIAGNOSIS — M25612 Stiffness of left shoulder, not elsewhere classified: Secondary | ICD-10-CM | POA: Diagnosis not present

## 2018-05-29 DIAGNOSIS — M25612 Stiffness of left shoulder, not elsewhere classified: Secondary | ICD-10-CM | POA: Diagnosis not present

## 2018-06-02 DIAGNOSIS — E785 Hyperlipidemia, unspecified: Secondary | ICD-10-CM | POA: Diagnosis not present

## 2018-06-02 DIAGNOSIS — E1129 Type 2 diabetes mellitus with other diabetic kidney complication: Secondary | ICD-10-CM | POA: Diagnosis not present

## 2018-06-02 DIAGNOSIS — E1159 Type 2 diabetes mellitus with other circulatory complications: Secondary | ICD-10-CM | POA: Diagnosis not present

## 2018-06-08 DIAGNOSIS — E1159 Type 2 diabetes mellitus with other circulatory complications: Secondary | ICD-10-CM | POA: Diagnosis not present

## 2018-06-08 DIAGNOSIS — I10 Essential (primary) hypertension: Secondary | ICD-10-CM | POA: Diagnosis not present

## 2018-06-08 DIAGNOSIS — E1129 Type 2 diabetes mellitus with other diabetic kidney complication: Secondary | ICD-10-CM | POA: Diagnosis not present

## 2018-06-08 DIAGNOSIS — E785 Hyperlipidemia, unspecified: Secondary | ICD-10-CM | POA: Diagnosis not present

## 2018-06-08 DIAGNOSIS — M7502 Adhesive capsulitis of left shoulder: Secondary | ICD-10-CM | POA: Diagnosis not present

## 2018-06-08 DIAGNOSIS — Z4789 Encounter for other orthopedic aftercare: Secondary | ICD-10-CM | POA: Diagnosis not present

## 2018-09-29 DIAGNOSIS — E785 Hyperlipidemia, unspecified: Secondary | ICD-10-CM | POA: Diagnosis not present

## 2018-09-29 DIAGNOSIS — E1129 Type 2 diabetes mellitus with other diabetic kidney complication: Secondary | ICD-10-CM | POA: Diagnosis not present

## 2018-09-29 DIAGNOSIS — E1159 Type 2 diabetes mellitus with other circulatory complications: Secondary | ICD-10-CM | POA: Diagnosis not present

## 2018-10-07 DIAGNOSIS — Z0000 Encounter for general adult medical examination without abnormal findings: Secondary | ICD-10-CM | POA: Diagnosis not present

## 2018-10-07 DIAGNOSIS — Z139 Encounter for screening, unspecified: Secondary | ICD-10-CM | POA: Diagnosis not present

## 2018-10-07 DIAGNOSIS — E1159 Type 2 diabetes mellitus with other circulatory complications: Secondary | ICD-10-CM | POA: Diagnosis not present

## 2018-10-07 DIAGNOSIS — I10 Essential (primary) hypertension: Secondary | ICD-10-CM | POA: Diagnosis not present

## 2018-10-07 DIAGNOSIS — E1129 Type 2 diabetes mellitus with other diabetic kidney complication: Secondary | ICD-10-CM | POA: Diagnosis not present

## 2018-10-07 DIAGNOSIS — Z1331 Encounter for screening for depression: Secondary | ICD-10-CM | POA: Diagnosis not present

## 2018-10-07 DIAGNOSIS — E785 Hyperlipidemia, unspecified: Secondary | ICD-10-CM | POA: Diagnosis not present

## 2019-02-01 DIAGNOSIS — E1129 Type 2 diabetes mellitus with other diabetic kidney complication: Secondary | ICD-10-CM | POA: Diagnosis not present

## 2019-02-01 DIAGNOSIS — Z125 Encounter for screening for malignant neoplasm of prostate: Secondary | ICD-10-CM | POA: Diagnosis not present

## 2019-02-01 DIAGNOSIS — E785 Hyperlipidemia, unspecified: Secondary | ICD-10-CM | POA: Diagnosis not present

## 2019-02-01 DIAGNOSIS — E1159 Type 2 diabetes mellitus with other circulatory complications: Secondary | ICD-10-CM | POA: Diagnosis not present

## 2019-02-08 DIAGNOSIS — Z23 Encounter for immunization: Secondary | ICD-10-CM | POA: Diagnosis not present

## 2019-02-08 DIAGNOSIS — E785 Hyperlipidemia, unspecified: Secondary | ICD-10-CM | POA: Diagnosis not present

## 2019-02-08 DIAGNOSIS — E1129 Type 2 diabetes mellitus with other diabetic kidney complication: Secondary | ICD-10-CM | POA: Diagnosis not present

## 2019-02-08 DIAGNOSIS — E1159 Type 2 diabetes mellitus with other circulatory complications: Secondary | ICD-10-CM | POA: Diagnosis not present

## 2019-02-08 DIAGNOSIS — I10 Essential (primary) hypertension: Secondary | ICD-10-CM | POA: Diagnosis not present

## 2019-02-19 DIAGNOSIS — N401 Enlarged prostate with lower urinary tract symptoms: Secondary | ICD-10-CM | POA: Diagnosis not present

## 2019-02-19 DIAGNOSIS — R972 Elevated prostate specific antigen [PSA]: Secondary | ICD-10-CM | POA: Diagnosis not present

## 2019-03-11 DIAGNOSIS — N4289 Other specified disorders of prostate: Secondary | ICD-10-CM | POA: Diagnosis not present

## 2019-03-11 DIAGNOSIS — R972 Elevated prostate specific antigen [PSA]: Secondary | ICD-10-CM | POA: Diagnosis not present

## 2019-03-11 DIAGNOSIS — N401 Enlarged prostate with lower urinary tract symptoms: Secondary | ICD-10-CM | POA: Diagnosis not present

## 2019-03-18 DIAGNOSIS — R972 Elevated prostate specific antigen [PSA]: Secondary | ICD-10-CM | POA: Diagnosis not present

## 2019-03-18 DIAGNOSIS — N401 Enlarged prostate with lower urinary tract symptoms: Secondary | ICD-10-CM | POA: Diagnosis not present

## 2019-05-14 DIAGNOSIS — U071 COVID-19: Secondary | ICD-10-CM | POA: Insufficient documentation

## 2019-05-14 HISTORY — DX: COVID-19: U07.1

## 2019-06-02 DIAGNOSIS — E785 Hyperlipidemia, unspecified: Secondary | ICD-10-CM | POA: Diagnosis not present

## 2019-06-02 DIAGNOSIS — E1159 Type 2 diabetes mellitus with other circulatory complications: Secondary | ICD-10-CM | POA: Diagnosis not present

## 2019-06-09 DIAGNOSIS — E785 Hyperlipidemia, unspecified: Secondary | ICD-10-CM | POA: Diagnosis not present

## 2019-06-09 DIAGNOSIS — I152 Hypertension secondary to endocrine disorders: Secondary | ICD-10-CM | POA: Diagnosis not present

## 2019-06-09 DIAGNOSIS — E1159 Type 2 diabetes mellitus with other circulatory complications: Secondary | ICD-10-CM | POA: Diagnosis not present

## 2019-06-09 DIAGNOSIS — E1129 Type 2 diabetes mellitus with other diabetic kidney complication: Secondary | ICD-10-CM | POA: Diagnosis not present

## 2019-06-21 DIAGNOSIS — Z20828 Contact with and (suspected) exposure to other viral communicable diseases: Secondary | ICD-10-CM | POA: Diagnosis not present

## 2019-06-26 DIAGNOSIS — R509 Fever, unspecified: Secondary | ICD-10-CM | POA: Diagnosis not present

## 2019-06-26 DIAGNOSIS — Z20828 Contact with and (suspected) exposure to other viral communicable diseases: Secondary | ICD-10-CM | POA: Diagnosis not present

## 2019-06-29 ENCOUNTER — Emergency Department (HOSPITAL_COMMUNITY): Payer: Medicare HMO

## 2019-06-29 ENCOUNTER — Inpatient Hospital Stay (HOSPITAL_COMMUNITY)
Admission: EM | Admit: 2019-06-29 | Discharge: 2019-06-30 | DRG: 177 | Disposition: A | Discharge status: Home / Self Care | Payer: Medicare HMO | Attending: Internal Medicine | Admitting: Internal Medicine

## 2019-06-29 ENCOUNTER — Other Ambulatory Visit (HOSPITAL_COMMUNITY): Payer: Self-pay

## 2019-06-29 ENCOUNTER — Encounter (HOSPITAL_COMMUNITY): Payer: Self-pay

## 2019-06-29 ENCOUNTER — Other Ambulatory Visit: Payer: Self-pay

## 2019-06-29 DIAGNOSIS — R0902 Hypoxemia: Secondary | ICD-10-CM | POA: Diagnosis not present

## 2019-06-29 DIAGNOSIS — E1122 Type 2 diabetes mellitus with diabetic chronic kidney disease: Secondary | ICD-10-CM | POA: Diagnosis not present

## 2019-06-29 DIAGNOSIS — I129 Hypertensive chronic kidney disease with stage 1 through stage 4 chronic kidney disease, or unspecified chronic kidney disease: Secondary | ICD-10-CM | POA: Diagnosis present

## 2019-06-29 DIAGNOSIS — E785 Hyperlipidemia, unspecified: Secondary | ICD-10-CM | POA: Diagnosis present

## 2019-06-29 DIAGNOSIS — J1282 Pneumonia due to coronavirus disease 2019: Secondary | ICD-10-CM | POA: Diagnosis present

## 2019-06-29 DIAGNOSIS — E1165 Type 2 diabetes mellitus with hyperglycemia: Secondary | ICD-10-CM | POA: Diagnosis not present

## 2019-06-29 DIAGNOSIS — R069 Unspecified abnormalities of breathing: Secondary | ICD-10-CM | POA: Diagnosis not present

## 2019-06-29 DIAGNOSIS — N179 Acute kidney failure, unspecified: Secondary | ICD-10-CM

## 2019-06-29 DIAGNOSIS — Z794 Long term (current) use of insulin: Secondary | ICD-10-CM

## 2019-06-29 DIAGNOSIS — Z8249 Family history of ischemic heart disease and other diseases of the circulatory system: Secondary | ICD-10-CM | POA: Diagnosis not present

## 2019-06-29 DIAGNOSIS — Z833 Family history of diabetes mellitus: Secondary | ICD-10-CM

## 2019-06-29 DIAGNOSIS — R531 Weakness: Secondary | ICD-10-CM | POA: Diagnosis not present

## 2019-06-29 DIAGNOSIS — T380X5A Adverse effect of glucocorticoids and synthetic analogues, initial encounter: Secondary | ICD-10-CM | POA: Diagnosis not present

## 2019-06-29 DIAGNOSIS — U071 COVID-19: Principal | ICD-10-CM

## 2019-06-29 DIAGNOSIS — J9601 Acute respiratory failure with hypoxia: Secondary | ICD-10-CM

## 2019-06-29 DIAGNOSIS — N183 Chronic kidney disease, stage 3 unspecified: Secondary | ICD-10-CM | POA: Diagnosis not present

## 2019-06-29 DIAGNOSIS — J189 Pneumonia, unspecified organism: Secondary | ICD-10-CM | POA: Diagnosis not present

## 2019-06-29 DIAGNOSIS — Y92239 Unspecified place in hospital as the place of occurrence of the external cause: Secondary | ICD-10-CM | POA: Diagnosis not present

## 2019-06-29 DIAGNOSIS — I10 Essential (primary) hypertension: Secondary | ICD-10-CM

## 2019-06-29 DIAGNOSIS — R0602 Shortness of breath: Secondary | ICD-10-CM | POA: Diagnosis not present

## 2019-06-29 DIAGNOSIS — I959 Hypotension, unspecified: Secondary | ICD-10-CM | POA: Diagnosis not present

## 2019-06-29 DIAGNOSIS — E119 Type 2 diabetes mellitus without complications: Secondary | ICD-10-CM

## 2019-06-29 DIAGNOSIS — R062 Wheezing: Secondary | ICD-10-CM | POA: Diagnosis not present

## 2019-06-29 HISTORY — DX: Type 2 diabetes mellitus without complications: E11.9

## 2019-06-29 HISTORY — DX: Essential (primary) hypertension: I10

## 2019-06-29 LAB — COMPREHENSIVE METABOLIC PANEL
ALT: 33 U/L (ref 0–44)
AST: 39 U/L (ref 15–41)
Albumin: 3.2 g/dL — ABNORMAL LOW (ref 3.5–5.0)
Alkaline Phosphatase: 53 U/L (ref 38–126)
Anion gap: 14 (ref 5–15)
BUN: 34 mg/dL — ABNORMAL HIGH (ref 8–23)
CO2: 25 mmol/L (ref 22–32)
Calcium: 9.5 mg/dL (ref 8.9–10.3)
Chloride: 98 mmol/L (ref 98–111)
Creatinine, Ser: 1.79 mg/dL — ABNORMAL HIGH (ref 0.61–1.24)
GFR calc Af Amer: 44 mL/min — ABNORMAL LOW (ref >60)
GFR calc non Af Amer: 38 mL/min — ABNORMAL LOW (ref >60)
Glucose, Bld: 163 mg/dL — ABNORMAL HIGH (ref 70–99)
Potassium: 3.8 mmol/L (ref 3.5–5.1)
Sodium: 137 mmol/L (ref 135–145)
Total Bilirubin: 0.8 mg/dL (ref 0.3–1.2)
Total Protein: 7.1 g/dL (ref 6.5–8.1)

## 2019-06-29 LAB — BASIC METABOLIC PANEL
Anion gap: 12 (ref 5–15)
BUN: 34 mg/dL — ABNORMAL HIGH (ref 8–23)
CO2: 26 mmol/L (ref 22–32)
Calcium: 9.2 mg/dL (ref 8.9–10.3)
Chloride: 100 mmol/L (ref 98–111)
Creatinine, Ser: 1.7 mg/dL — ABNORMAL HIGH (ref 0.61–1.24)
GFR calc Af Amer: 47 mL/min — ABNORMAL LOW (ref >60)
GFR calc non Af Amer: 40 mL/min — ABNORMAL LOW (ref >60)
Glucose, Bld: 165 mg/dL — ABNORMAL HIGH (ref 70–99)
Potassium: 3.9 mmol/L (ref 3.5–5.1)
Sodium: 138 mmol/L (ref 135–145)

## 2019-06-29 LAB — ABO/RH: ABO/RH(D): O POS

## 2019-06-29 LAB — PROCALCITONIN: Procalcitonin: <0.1 ng/mL

## 2019-06-29 LAB — URINALYSIS, ROUTINE W REFLEX MICROSCOPIC
Bacteria, UA: NONE SEEN
Bilirubin Urine: NEGATIVE
Glucose, UA: NEGATIVE mg/dL
Hgb urine dipstick: NEGATIVE
Ketones, ur: 5 mg/dL — AB
Leukocytes,Ua: NEGATIVE
Nitrite: NEGATIVE
Protein, ur: 30 mg/dL — AB
Specific Gravity, Urine: 1.02 (ref 1.005–1.030)
pH: 5 (ref 5.0–8.0)

## 2019-06-29 LAB — TRIGLYCERIDES: Triglycerides: 139 mg/dL (ref <150)

## 2019-06-29 LAB — FERRITIN: Ferritin: 449 ng/mL — ABNORMAL HIGH (ref 24–336)

## 2019-06-29 LAB — CBC
HCT: 41.3 % (ref 39.0–52.0)
Hemoglobin: 14.5 g/dL (ref 13.0–17.0)
MCH: 31 pg (ref 26.0–34.0)
MCHC: 35.1 g/dL (ref 30.0–36.0)
MCV: 88.4 fL (ref 80.0–100.0)
Platelets: 274 10*3/uL (ref 150–400)
RBC: 4.67 MIL/uL (ref 4.22–5.81)
RDW: 12.7 % (ref 11.5–15.5)
WBC: 6.9 10*3/uL (ref 4.0–10.5)
nRBC: 0 % (ref 0.0–0.2)

## 2019-06-29 LAB — LACTATE DEHYDROGENASE: LDH: 209 U/L — ABNORMAL HIGH (ref 98–192)

## 2019-06-29 LAB — FIBRINOGEN: Fibrinogen: 659 mg/dL — ABNORMAL HIGH (ref 210–475)

## 2019-06-29 LAB — C-REACTIVE PROTEIN: CRP: 14.8 mg/dL — ABNORMAL HIGH (ref <1.0)

## 2019-06-29 LAB — GLUCOSE, CAPILLARY
Glucose-Capillary: 145 mg/dL — ABNORMAL HIGH (ref 70–99)
Glucose-Capillary: 334 mg/dL — ABNORMAL HIGH (ref 70–99)

## 2019-06-29 LAB — LACTIC ACID, PLASMA
Lactic Acid, Venous: 1.4 mmol/L (ref 0.5–1.9)
Lactic Acid, Venous: 1.2 mmol/L (ref 0.5–1.9)

## 2019-06-29 LAB — D-DIMER, QUANTITATIVE: D-Dimer, Quant: 0.74 ug/mL-FEU — ABNORMAL HIGH (ref 0.00–0.50)

## 2019-06-29 MED ORDER — DOCUSATE SODIUM 100 MG PO CAPS
100.0000 mg | ORAL_CAPSULE | Freq: Two times a day (BID) | ORAL | Status: DC
  Administered 2019-06-29 – 2019-06-30 (×2): 100 mg via ORAL
  Filled 2019-06-29 (×3): qty 1

## 2019-06-29 MED ORDER — HYDRALAZINE HCL 25 MG PO TABS
25.0000 mg | ORAL_TABLET | Freq: Four times a day (QID) | ORAL | Status: DC | PRN
  Filled 2019-06-29: qty 1

## 2019-06-29 MED ORDER — SODIUM CHLORIDE 0.9 % IV SOLN
100.0000 mg | Freq: Every day | INTRAVENOUS | Status: DC
  Administered 2019-06-30: 100 mg via INTRAVENOUS
  Filled 2019-06-29: qty 20

## 2019-06-29 MED ORDER — ALBUMIN HUMAN 25 % IV SOLN
12.5000 g | Freq: Four times a day (QID) | INTRAVENOUS | Status: DC
  Filled 2019-06-29 (×2): qty 50

## 2019-06-29 MED ORDER — SODIUM CHLORIDE 0.9 % IV SOLN
200.0000 mg | Freq: Once | INTRAVENOUS | Status: AC
  Administered 2019-06-29: 200 mg via INTRAVENOUS
  Filled 2019-06-29: qty 40

## 2019-06-29 MED ORDER — CARVEDILOL 3.125 MG PO TABS
3.1250 mg | ORAL_TABLET | Freq: Two times a day (BID) | ORAL | Status: DC
  Administered 2019-06-29 – 2019-06-30 (×2): 3.125 mg via ORAL
  Filled 2019-06-29 (×2): qty 1

## 2019-06-29 MED ORDER — INSULIN ASPART 100 UNIT/ML ~~LOC~~ SOLN
0.0000 [IU] | Freq: Three times a day (TID) | SUBCUTANEOUS | Status: DC
  Administered 2019-06-30 (×2): 3 [IU] via SUBCUTANEOUS

## 2019-06-29 MED ORDER — INSULIN ASPART 100 UNIT/ML ~~LOC~~ SOLN
0.0000 [IU] | Freq: Three times a day (TID) | SUBCUTANEOUS | Status: DC
  Administered 2019-06-29: 1 [IU] via SUBCUTANEOUS

## 2019-06-29 MED ORDER — GUAIFENESIN-DM 100-10 MG/5ML PO SYRP
10.0000 mL | ORAL_SOLUTION | ORAL | Status: DC | PRN
  Administered 2019-06-29: 10 mL via ORAL
  Filled 2019-06-29: qty 10

## 2019-06-29 MED ORDER — INSULIN ASPART 100 UNIT/ML ~~LOC~~ SOLN
0.0000 [IU] | Freq: Every day | SUBCUTANEOUS | Status: DC
  Administered 2019-06-29: 4 [IU] via SUBCUTANEOUS

## 2019-06-29 MED ORDER — DEXAMETHASONE 6 MG PO TABS
6.0000 mg | ORAL_TABLET | ORAL | Status: DC
  Administered 2019-06-29: 6 mg via ORAL
  Filled 2019-06-29: qty 1
  Filled 2019-06-29: qty 2

## 2019-06-29 MED ORDER — INSULIN GLARGINE 100 UNIT/ML ~~LOC~~ SOLN
5.0000 [IU] | Freq: Every day | SUBCUTANEOUS | Status: DC
  Administered 2019-06-29: 5 [IU] via SUBCUTANEOUS
  Filled 2019-06-29 (×2): qty 0.05

## 2019-06-29 MED ORDER — IPRATROPIUM-ALBUTEROL 20-100 MCG/ACT IN AERS
1.0000 | INHALATION_SPRAY | Freq: Four times a day (QID) | RESPIRATORY_TRACT | Status: DC
  Administered 2019-06-29 – 2019-06-30 (×3): 1 via RESPIRATORY_TRACT
  Filled 2019-06-29 (×2): qty 4

## 2019-06-29 MED ORDER — HEPARIN SODIUM (PORCINE) 5000 UNIT/ML IJ SOLN
5000.0000 [IU] | Freq: Three times a day (TID) | INTRAMUSCULAR | Status: DC
  Administered 2019-06-29 – 2019-06-30 (×3): 5000 [IU] via SUBCUTANEOUS
  Filled 2019-06-29 (×3): qty 1

## 2019-06-29 MED ORDER — SODIUM CHLORIDE 0.9 % IV SOLN: INTRAVENOUS | Status: DC

## 2019-06-29 MED ORDER — ATORVASTATIN CALCIUM 10 MG PO TABS
20.0000 mg | ORAL_TABLET | Freq: Every day | ORAL | Status: DC
  Administered 2019-06-29: 20 mg via ORAL
  Filled 2019-06-29: qty 2

## 2019-06-29 MED ORDER — ACETAMINOPHEN 325 MG PO TABS
650.0000 mg | ORAL_TABLET | Freq: Four times a day (QID) | ORAL | Status: DC | PRN
  Administered 2019-06-29: 650 mg via ORAL
  Filled 2019-06-29: qty 2

## 2019-06-29 NOTE — Plan of Care (Signed)
Patient will continue to feel better.  Breathing without the need of oxygen. Blood pressure controlled. Renal ultrasound to be done.  Monitor blood glucose levels.

## 2019-06-29 NOTE — ED Triage Notes (Signed)
Pt from [facility] with EMS, tested positive for COVID 7 days ago, reports worsening cough, SOB and generalized weakness, Pt was 86% on 2L when EMS arrived to the office. Pt currently on 3L with saturations at 91/92%. Pt alert, resp e.u at this time

## 2019-06-29 NOTE — Progress Notes (Signed)
Patient seen and examined and agree with plan of care as per admitting physician who saw him about an hour ago  69 year old male DM TY 2 HTN HLD tested positive to 2.8.21 been feeling increasingly short of breath desatted to 85% He got his Covid shot on Friday  Was feelign slight fever and SOB  Has had DM ty ii since age 17 Meds not reconciled yet   COVID-19 Labs  Recent Labs    06/29/19 1143  DDIMER 0.74*  FERRITIN 449*  LDH 209*  CRP 14.8*    No results found for: SARSCOV2NAA  Lab Results  Component Value Date   CREATININE 1.79 (H) 06/29/2019   CREATININE 1.70 (H) 06/29/2019   Found to have possible AKI in addition  Blood pressure (!) 114/97, pulse 99, temperature 100.3 F (37.9 C), temperature source Oral, resp. rate 18, SpO2 98 %. On exam Awake coherent in nad pvc with irregular beat son montiors cta b no adde dosudn no rale sno rhonchi   Plan Empiric remdisivir--would hold further steroids  ambulate around unit later Likely can be candidate for early d/c for OP completion remdisivir in 1-2d   Pleas Koch, MD [facility] 3:54 PM

## 2019-06-29 NOTE — ED Provider Notes (Signed)
[name] EMERGENCY DEPARTMENT Provider Note   CSN: [id number] Arrival date & time: 06/29/19  1032     History Chief Complaint  Patient presents with  . COVID+  . Shortness of Breath  . Weakness    Wayne Marshall is a 69 y.o. male.  69 year old male presents with complaint of shortness of breath with generalized weakness with positive Covid test results.  Patient states that his wife became ill so he had a screening Covid test completed on February 8 which was positive.  Patient states about 2 to 3 days later he began to feel sick.  Symptoms became worse today so he went to urgent care where he was found to have an oxygen saturation of 86% on room air and EMS was called for transport.  Patient was placed on 2 L nasal cannula with improvement in his symptoms and O2 sat increased to the mid 90s.  Patient has not had Tylenol since last night, denies known fevers, chills, sweats.  States he has a frequent nonproductive cough and feels short of breath.  Patient has a history of hypertension and diabetes, no history of asthma or chronic lung disease, is a non-smoker.  No other complaints or concerns.  Wayne Marshall was evaluated in Emergency Department on 06/29/2019 for the symptoms described in the history of present illness. He was evaluated in the context of the global COVID-19 pandemic, which necessitated consideration that the patient might be at risk for infection with the SARS-CoV-2 virus that causes COVID-19. Institutional protocols and algorithms that pertain to the evaluation of patients at risk for COVID-19 are in a state of rapid change based on information released by regulatory bodies including the CDC and federal and state organizations. These policies and algorithms were followed during the patient's care in the ED.         Past Medical History:  Diagnosis Date  . Diabetes mellitus without complication (HCC)   . Hypertension     There are no problems to  display for this patient.   History reviewed. No pertinent surgical history.     No family history on file.  Social History   Tobacco Use  . Smoking status: Not on file  Substance Use Topics  . Alcohol use: Not on file  . Drug use: Not on file    Home Medications Prior to Admission medications   Not on File    Allergies    Patient has no allergy information on record.  Review of Systems   Review of Systems  Constitutional: Negative for chills, diaphoresis and fever.  HENT: Negative for congestion.   Respiratory: Positive for cough and shortness of breath.   Cardiovascular: Negative for chest pain.  Gastrointestinal: Negative for abdominal pain, diarrhea, nausea and vomiting.  Genitourinary: Negative for difficulty urinating.  Skin: Negative for rash and wound.  Allergic/Immunologic: Positive for immunocompromised state.  Neurological: Positive for weakness.  Hematological: Negative for adenopathy.  Psychiatric/Behavioral: Negative for confusion.  All other systems reviewed and are negative.   Physical Exam Updated Vital Signs BP 120/81   Pulse 84   Temp 100.3 F (37.9 C) (Oral)   Resp (!) 24   SpO2 95%   Physical Exam Vitals and nursing note reviewed.  Constitutional:      General: He is not in acute distress.    Appearance: He is well-developed. He is not diaphoretic.  HENT:     Head: Normocephalic and atraumatic.  Cardiovascular:     Rate and  Rhythm: Normal rate and regular rhythm.  Pulmonary:     Effort: Pulmonary effort is normal.     Breath sounds: Normal breath sounds. No decreased breath sounds.  Abdominal:     Palpations: Abdomen is soft.     Tenderness: There is no abdominal tenderness.  Musculoskeletal:     Right lower leg: No tenderness. No edema.     Left lower leg: No tenderness. No edema.  Skin:    General: Skin is dry.     Findings: No erythema or rash.  Neurological:     Mental Status: He is alert and oriented to person, place,  and time.  Psychiatric:        Behavior: Behavior normal.     ED Results / Procedures / Treatments   Labs (all labs ordered are listed, but only abnormal results are displayed) Labs Reviewed  BASIC METABOLIC PANEL - Abnormal; Notable for the following components:      Result Value   Glucose, Bld 165 (*)    BUN 34 (*)    Creatinine, Ser 1.70 (*)    GFR calc non Af Amer 40 (*)    GFR calc Af Amer 47 (*)    All other components within normal limits  COMPREHENSIVE METABOLIC PANEL - Abnormal; Notable for the following components:   Glucose, Bld 163 (*)    BUN 34 (*)    Creatinine, Ser 1.79 (*)    Albumin 3.2 (*)    GFR calc non Af Amer 38 (*)    GFR calc Af Amer 44 (*)    All other components within normal limits  D-DIMER, QUANTITATIVE (NOT AT [facility]) - Abnormal; Notable for the following components:   D-Dimer, Quant 0.74 (*)    All other components within normal limits  LACTATE DEHYDROGENASE - Abnormal; Notable for the following components:   LDH 209 (*)    All other components within normal limits  FERRITIN - Abnormal; Notable for the following components:   Ferritin 449 (*)    All other components within normal limits  FIBRINOGEN - Abnormal; Notable for the following components:   Fibrinogen 659 (*)    All other components within normal limits  C-REACTIVE PROTEIN - Abnormal; Notable for the following components:   CRP 14.8 (*)    All other components within normal limits  CULTURE, BLOOD (ROUTINE X 2)  CULTURE, BLOOD (ROUTINE X 2)  CBC  LACTIC ACID, PLASMA  TRIGLYCERIDES  LACTIC ACID, PLASMA  PROCALCITONIN    EKG EKG Interpretation  Date/Time:  Tuesday June 29 2019 10:35:50 EST Ventricular Rate:  89 PR Interval:  110 QRS Duration: 76 QT Interval:  344 QTC Calculation: 418 R Axis:   31 Text Interpretation: Sinus rhythm with short PR with Premature atrial complexes Nonspecific ST abnormality Abnormal ECG Confirmed by Virgina Norfolk [phone]) on 06/29/2019  10:56:46 AM   Radiology DG Chest Port 1 View  Result Date: 06/29/2019 CLINICAL DATA:  Shortness of breath, COVID-19 positive EXAM: PORTABLE CHEST 1 VIEW COMPARISON:  04/18/2011 FINDINGS: Heart size is within normal limits. Hazy bilateral airspace opacities most pronounced within the peripheral aspect of the right upper lobe as well as within the mid to lower aspect of the left lung. No appreciable pleural fluid collection. No pneumothorax. Osseous structures appear intact. IMPRESSION: Hazy bilateral airspace opacities most pronounced within the right upper lobe as well as within the mid to lower aspect of the left lung. Findings suggestive of multifocal viral pneumonia in the setting of known  COVID 19 infection. Electronically Signed   By: Davina Poke D.O.   On: 06/29/2019 12:40    Procedures Procedures (including critical care time)  Medications Ordered in ED Medications - No data to display  ED Course  I have reviewed the triage vital signs and the nursing notes.  Pertinent labs & imaging results that were available during my care of the patient were reviewed by me and considered in my medical decision making (see chart for details).  Clinical Course as of Jun 28 1398  Tue Jun 29, 2019  1108 Lab Results - documented in this encounter  SARS-COV-2 RNA, QL, RT PCR (COVID-19) (06/21/2019 10:31 AM EST) SARS-COV-2 RNA, QL, RT PCR (COVID-19) (06/21/2019 10:31 AM EST) Component Value Ref Range Performed At Pathologist Signature SARS-CoV-2, NAA Detected (A)            [LM]  1578 69 year old male with positive Covid test 1 week ago, onset of symptoms approximately 4 days ago, progressively worsening.  Patient brought in by EMS with O2 sat of 86% at urgent care today.  On exam, patient is well-appearing, reports generalized weakness with shortness of breath and a nonproductive cough.  Patient has diet-controlled hypertension, is a non-insulin-dependent diabetic.  Off of oxygen and  resting in the room patient is satting in the upper 90s, with ambulation he drops as low as 91% on room air.  Plan is to obtain chest x-ray and lab work, will continue to monitor patient off of supplemental oxygen.  Patient's temperature today is 100.3, has not had Tylenol lately.  He says he is not feeling feverish at this time.   [LM]  4166 On recheck, patient is satting 85% on room air, will place on [place] and consult for admission.    [LM]  1400 Case discussed with Dr. Roosevelt Locks with hospital service who will consult for admission.   [LM]    Clinical Course User Index [LM] Roque Lias   MDM Rules/Calculators/A&P                      Final Clinical Impression(s) / ED Diagnoses Final diagnoses:  COVID-19  Hypoxemia    Rx / DC Orders ED Discharge Orders    None       Tacy Learn, PA-C 06/29/19 [place], [place], DO 06/29/19 1634

## 2019-06-29 NOTE — H&P (Signed)
History and Physical    Pax Reasoner BZJ:[phone] DOB: 09/05/1950 DOA: 06/29/2019  PCP: Marco Collie, MD   Patient coming from: Home  I have personally briefly reviewed patient's old medical records in [facility]  Chief Complaint:Cough and short of breath  HPI: Wayne Marshall is a 69 y.o. male with medical history significant of IIDM, HTN, HLD, presents with increasing shortness of breath, dry cough with generalized weakness for 3 days. His wife was tested positive for COVID last week. Thus he went to test on February 8 which was positive. For the past 2 to 3 days later he began to feel increasing short of breath, and dry cough.  He denied any fever chills, no chest pain, no diarrhea no loss of taste no muscle aching. ED Course: EMS found patient oxygenation 96% room air, stabilized on 3 L, however desatted again to 85% room air in the ED.  Review of Systems: As per HPI otherwise 10 point review of systems negative.    Past Medical History:  Diagnosis Date  . Diabetes mellitus without complication ([place])   . Hypertension     History reviewed. No pertinent surgical history.   has no history on file for tobacco, alcohol, and drug.  Not on File  Diabetes and hypertension run in the family   Prior to Admission medications   Not on File    Physical Exam: Vitals:   06/29/19 1334 06/29/19 1335 06/29/19 1336 06/29/19 1337  BP:      Pulse: 88 87 84 84  Resp: (!) 22 (!) 22 (!) 24 (!) 24  Temp:      TempSrc:      SpO2: 95% 96% 95% 95%    Constitutional: NAD, calm, comfortable Vitals:   06/29/19 1334 06/29/19 1335 06/29/19 1336 06/29/19 1337  BP:      Pulse: 88 87 84 84  Resp: (!) 22 (!) 22 (!) 24 (!) 24  Temp:      TempSrc:      SpO2: 95% 96% 95% 95%   Eyes: PERRL, lids and conjunctivae normal ENMT: Mucous membranes are moist. Posterior pharynx clear of any exudate or lesions.Normal dentition.  Neck: normal, supple, no masses, no thyromegaly Respiratory: clear  to auscultation bilaterally, no wheezing, no crackles. Normal respiratory effort. No accessory muscle use.  Cardiovascular: Regular rate and rhythm, no murmurs / rubs / gallops. No extremity edema. 2+ pedal pulses. No carotid bruits.  Abdomen: no tenderness, no masses palpated. No hepatosplenomegaly. Bowel sounds positive.  Musculoskeletal: no clubbing / cyanosis. No joint deformity upper and lower extremities. Good ROM, no contractures. Normal muscle tone.  Skin: no rashes, lesions, ulcers. No induration Neurologic: CN 2-12 grossly intact. Sensation intact, DTR normal. Strength 5/5 in all 4.  Psychiatric: Normal judgment and insight. Alert and oriented x 3. Normal mood.    Labs on Admission: I have personally reviewed following labs and imaging studies  CBC: Recent Labs  Lab 06/29/19 1037  WBC 6.9  HGB 14.5  HCT 41.3  MCV 88.4  PLT 017   Basic Metabolic Panel: Recent Labs  Lab 06/29/19 1037 06/29/19 1143  NA 138 137  K 3.9 3.8  CL 100 98  CO2 26 25  GLUCOSE 165* 163*  BUN 34* 34*  CREATININE 1.70* 1.79*  CALCIUM 9.2 9.5   GFR: CrCl cannot be calculated (Unknown ideal weight.). Liver Function Tests: Recent Labs  Lab 06/29/19 1143  AST 39  ALT 33  ALKPHOS 53  BILITOT 0.8  PROT  7.1  ALBUMIN 3.2*   No results for input(s): LIPASE, AMYLASE in the last 168 hours. No results for input(s): AMMONIA in the last 168 hours. Coagulation Profile: No results for input(s): INR, PROTIME in the last 168 hours. Cardiac Enzymes: No results for input(s): CKTOTAL, CKMB, CKMBINDEX, TROPONINI in the last 168 hours. BNP (last 3 results) No results for input(s): PROBNP in the last 8760 hours. HbA1C: No results for input(s): HGBA1C in the last 72 hours. CBG: No results for input(s): GLUCAP in the last 168 hours. Lipid Profile: Recent Labs    06/29/19 1107  TRIG 139   Thyroid Function Tests: No results for input(s): TSH, T4TOTAL, FREET4, T3FREE, THYROIDAB in the last 72  hours. Anemia Panel: Recent Labs    06/29/19 1143  FERRITIN 449*   Urine analysis: No results found for: COLORURINE, APPEARANCEUR, LABSPEC, PHURINE, GLUCOSEU, HGBUR, BILIRUBINUR, KETONESUR, PROTEINUR, UROBILINOGEN, NITRITE, LEUKOCYTESUR  Radiological Exams on Admission: DG Chest Port 1 View  Result Date: 06/29/2019 CLINICAL DATA:  Shortness of breath, COVID-19 positive EXAM: PORTABLE CHEST 1 VIEW COMPARISON:  04/18/2011 FINDINGS: Heart size is within normal limits. Hazy bilateral airspace opacities most pronounced within the peripheral aspect of the right upper lobe as well as within the mid to lower aspect of the left lung. No appreciable pleural fluid collection. No pneumothorax. Osseous structures appear intact. IMPRESSION: Hazy bilateral airspace opacities most pronounced within the right upper lobe as well as within the mid to lower aspect of the left lung. Findings suggestive of multifocal viral pneumonia in the setting of known COVID 19 infection. Electronically Signed   By: Duanne Guess D.O.   On: 06/29/2019 12:40    EKG: Independently reviewed. Sinus Arrhythmia with PVCs  Assessment/Plan Active Problems:   COVID-19 virus infection   COVID-19  COVID-19 pneumonia with acute hypoxic respite failure Positive Covid test documented in care everywhere on February 8 Remdesivir and Decadron Breathing treatment Wean down oxygen to 93% H frequent body position change/proning  IDDM Sliding scale for now  AKI versus CKD Kidney ultrasound, albumin for hydration recheck BMP tomorrow  HTN Continue home meds, add as needed hydralazine  HLD On statin     DVT prophylaxis: Heparin subcu Code Status: Full code Family Communication: None at bedside Disposition Plan: Home once oxygenation improved likely in 2 to 3 days Consults called: None Admission status: Telemetry admission in [facility]   Emeline General MD [facility] Pager [phone]    06/29/2019, 2:13  PM

## 2019-06-30 ENCOUNTER — Inpatient Hospital Stay (HOSPITAL_COMMUNITY): Payer: Medicare HMO

## 2019-06-30 DIAGNOSIS — J1282 Pneumonia due to coronavirus disease 2019: Secondary | ICD-10-CM

## 2019-06-30 DIAGNOSIS — J9601 Acute respiratory failure with hypoxia: Secondary | ICD-10-CM

## 2019-06-30 DIAGNOSIS — N179 Acute kidney failure, unspecified: Secondary | ICD-10-CM

## 2019-06-30 DIAGNOSIS — N183 Chronic kidney disease, stage 3 unspecified: Secondary | ICD-10-CM

## 2019-06-30 DIAGNOSIS — E119 Type 2 diabetes mellitus without complications: Secondary | ICD-10-CM

## 2019-06-30 DIAGNOSIS — E1122 Type 2 diabetes mellitus with diabetic chronic kidney disease: Secondary | ICD-10-CM

## 2019-06-30 DIAGNOSIS — U071 COVID-19: Secondary | ICD-10-CM

## 2019-06-30 DIAGNOSIS — I10 Essential (primary) hypertension: Secondary | ICD-10-CM

## 2019-06-30 HISTORY — DX: Chronic kidney disease, stage 3 unspecified: N18.30

## 2019-06-30 HISTORY — DX: Acute respiratory failure with hypoxia: J96.01

## 2019-06-30 HISTORY — DX: COVID-19: U07.1

## 2019-06-30 HISTORY — DX: Essential (primary) hypertension: I10

## 2019-06-30 HISTORY — DX: Type 2 diabetes mellitus with diabetic chronic kidney disease: E11.22

## 2019-06-30 HISTORY — DX: Acute kidney failure, unspecified: N17.9

## 2019-06-30 HISTORY — DX: Type 2 diabetes mellitus without complications: E11.9

## 2019-06-30 HISTORY — DX: Pneumonia due to coronavirus disease 2019: J12.82

## 2019-06-30 LAB — COMPREHENSIVE METABOLIC PANEL
ALT: 51 U/L — ABNORMAL HIGH (ref 0–44)
AST: 58 U/L — ABNORMAL HIGH (ref 15–41)
Albumin: 3.1 g/dL — ABNORMAL LOW (ref 3.5–5.0)
Alkaline Phosphatase: 51 U/L (ref 38–126)
Anion gap: 12 (ref 5–15)
BUN: 40 mg/dL — ABNORMAL HIGH (ref 8–23)
CO2: 24 mmol/L (ref 22–32)
Calcium: 9.5 mg/dL (ref 8.9–10.3)
Chloride: 102 mmol/L (ref 98–111)
Creatinine, Ser: 1.58 mg/dL — ABNORMAL HIGH (ref 0.61–1.24)
GFR calc Af Amer: 51 mL/min — ABNORMAL LOW (ref >60)
GFR calc non Af Amer: 44 mL/min — ABNORMAL LOW (ref >60)
Glucose, Bld: 262 mg/dL — ABNORMAL HIGH (ref 70–99)
Potassium: 3.8 mmol/L (ref 3.5–5.1)
Sodium: 138 mmol/L (ref 135–145)
Total Bilirubin: 0.9 mg/dL (ref 0.3–1.2)
Total Protein: 7.1 g/dL (ref 6.5–8.1)

## 2019-06-30 LAB — PHOSPHORUS: Phosphorus: 2.8 mg/dL (ref 2.5–4.6)

## 2019-06-30 LAB — CBC WITH DIFFERENTIAL/PLATELET
Abs Immature Granulocytes: 0.01 10*3/uL (ref 0.00–0.07)
Basophils Absolute: 0 10*3/uL (ref 0.0–0.1)
Basophils Relative: 0 %
Eosinophils Absolute: 0 10*3/uL (ref 0.0–0.5)
Eosinophils Relative: 0 %
HCT: 40.8 % (ref 39.0–52.0)
Hemoglobin: 13.9 g/dL (ref 13.0–17.0)
Immature Granulocytes: 0 %
Lymphocytes Relative: 15 %
Lymphs Abs: 0.5 10*3/uL — ABNORMAL LOW (ref 0.7–4.0)
MCH: 29.8 pg (ref 26.0–34.0)
MCHC: 34.1 g/dL (ref 30.0–36.0)
MCV: 87.4 fL (ref 80.0–100.0)
Monocytes Absolute: 0.2 10*3/uL (ref 0.1–1.0)
Monocytes Relative: 6 %
Neutro Abs: 2.5 10*3/uL (ref 1.7–7.7)
Neutrophils Relative %: 79 %
Platelets: 287 10*3/uL (ref 150–400)
RBC: 4.67 MIL/uL (ref 4.22–5.81)
RDW: 12.5 % (ref 11.5–15.5)
WBC: 3.1 10*3/uL — ABNORMAL LOW (ref 4.0–10.5)
nRBC: 0 % (ref 0.0–0.2)

## 2019-06-30 LAB — HEMOGLOBIN A1C
Hgb A1c MFr Bld: 6.9 % — ABNORMAL HIGH (ref 4.8–5.6)
Mean Plasma Glucose: 151.33 mg/dL

## 2019-06-30 LAB — MAGNESIUM: Magnesium: 2.1 mg/dL (ref 1.7–2.4)

## 2019-06-30 LAB — D-DIMER, QUANTITATIVE: D-Dimer, Quant: 0.47 ug/mL-FEU (ref 0.00–0.50)

## 2019-06-30 LAB — FERRITIN: Ferritin: 455 ng/mL — ABNORMAL HIGH (ref 24–336)

## 2019-06-30 LAB — C-REACTIVE PROTEIN: CRP: 14 mg/dL — ABNORMAL HIGH (ref <1.0)

## 2019-06-30 MED ORDER — ACETAMINOPHEN 325 MG PO TABS: 650.0000 mg | ORAL_TABLET | Freq: Four times a day (QID) | ORAL | Status: DC | PRN

## 2019-06-30 MED ORDER — GUAIFENESIN-DM 100-10 MG/5ML PO SYRP: 10.0000 mL | ORAL_SOLUTION | ORAL | 0 refills | Status: DC | PRN

## 2019-06-30 MED ORDER — GUAIFENESIN-DM 100-10 MG/5ML PO SYRP
10.0000 mL | ORAL_SOLUTION | Freq: Four times a day (QID) | ORAL | Status: DC
  Administered 2019-06-30: 10 mL via ORAL
  Filled 2019-06-30: qty 10

## 2019-06-30 MED ORDER — METFORMIN HCL 500 MG PO TABS: 1000.0000 mg | ORAL_TABLET | Freq: Two times a day (BID) | ORAL | Status: DC

## 2019-06-30 MED ORDER — IPRATROPIUM-ALBUTEROL 20-100 MCG/ACT IN AERS: 1.0000 | INHALATION_SPRAY | Freq: Four times a day (QID) | RESPIRATORY_TRACT | 0 refills | Status: DC | PRN

## 2019-06-30 MED ORDER — GLIPIZIDE 5 MG PO TABS: ORAL_TABLET | ORAL | 0 refills | Status: DC

## 2019-06-30 MED ORDER — DEXAMETHASONE 6 MG PO TABS: 6.0000 mg | ORAL_TABLET | ORAL | 0 refills | Status: AC

## 2019-06-30 MED ORDER — ALLOPURINOL 100 MG PO TABS
100.0000 mg | ORAL_TABLET | Freq: Every day | ORAL | Status: DC
  Administered 2019-06-30: 100 mg via ORAL
  Filled 2019-06-30: qty 1

## 2019-06-30 NOTE — Progress Notes (Signed)
Patient scheduled for outpatient Remdesivir infusion at 10:00 AM on Thursday 2/18, Friday 2/19, and Saturday 2/20.  Please advise them to report to [facility] at [address]  Drive to the security guard and tell them you are here for an infusion. They will direct you to the front entrance where we will come and get you.  For questions call [phone].  Thanks

## 2019-06-30 NOTE — Discharge Instructions (Signed)
You are scheduled for an outpatient infusion of Remdesivir at 10:00 AM on Thursday 2/18, Friday 2/19, and Saturday 2/20.  Please report to [facility] at [address].  Drive to the security guard and tell them you are here for an infusion. They will direct you to the front entrance where we will come and get you.  For questions call [phone].  Thanks      Check your sugars 3 x day prior to breakfast/lunch and dinner. Take Glipizide before breakfast and dinner if sugar is > 200. If sugars are persistently > 400, please call you primary care doctor for further instructions. Drink 6-8 glasses of water a day. Resume your Lisinopril/HCTZ in 5 days.   You were cared for by a hospitalist during your hospital stay. If you have any questions about your discharge medications or the care you received while you were in the hospital after you are discharged, you can call the unit and asked to speak with the hospitalist on call if the hospitalist that took care of you is not available. Once you are discharged, your primary care physician will handle any further medical issues.   Please note that NO REFILLS for any discharge medications will be authorized once you are discharged, as it is imperative that you return to your primary care physician (or establish a relationship with a primary care physician if you do not have one) for your aftercare needs so that they can reassess your need for medications and monitor your lab values.  Please take all your medications with you for your next visit with your Primary MD. Please ask your Primary MD to get all Hospital records sent to his/her office. Please request your Primary MD to go over all hospital test results at the follow up.   If you experience worsening of your admission symptoms, develop shortness of breath, chest pain, suicidal or homicidal thoughts or a life threatening emergency, you must seek medical attention immediately by calling 911  or calling your MD.   Bonita Quin must read the complete instructions/literature along with all the possible adverse reactions/side effects for all the medicines you take including new medications that have been prescribed to you. Take new medicines after you have completely understood and accpet all the possible adverse reactions/side effects.    Do not drive when taking pain medications or sedatives.     Do not take more than prescribed Pain, Sleep and Anxiety Medications   If you have smoked or chewed Tobacco in the last 2 yrs please stop. Stop any regular alcohol  and or recreational drug use.   Wear Seat belts while driving.

## 2019-06-30 NOTE — Progress Notes (Signed)
   06/30/19 0600  Continuous Glucose Monitoring   CGM Location  [facility]  CGM Date Applied  06/30/19  Libre placed

## 2019-06-30 NOTE — Discharge Summary (Signed)
Physician Discharge Summary  Wayne Marshall YPP:[id number] DOB: 1950/06/29 DOA: 06/29/2019  PCP: Abner Greenspan, MD  Admit date: 06/29/2019 Discharge date: 06/30/2019  Admitted From: home Disposition:  home   Recommendations for Outpatient Follow-up:  1. Outpatient Remdesivir  Home Health:  none  Discharge Condition:  stable   CODE STATUS:  Full code   Diet recommendation:  Heart healthy, diabetic Consultations:  none   Procedures/Studies: none   Discharge Diagnoses:  Principal Problem:   Pneumonia due to COVID-19 virus Active Problems:   Acute respiratory failure with hypoxia (HCC)   DM2 (diabetes mellitus, type 2) (HCC)   HTN (hypertension)   AKI (acute kidney injury) (HCC)   CKD stage 3 secondary to diabetes [facility])     HPI: Wayne Marshall is a 69 y.o. male with medical history significant of IIDM, HTN, HLD, presents with increasing shortness of breath, dry cough with generalized weakness for 3 days. His wife was tested positive for COVID last week. Thus he went to test on February 8 which was positive. For the past 2 to 3 days later he began to feel increasing short of breath, and dry cough.  He denied any fever chills, no chest pain, no diarrhea no loss of taste no muscle aching.  Hospital Course:  COVID-19 pneumonia with acute hypoxic respiratory failure -The patient has improved with treatment with remdesivir and steroids -We have been able to wean him off of oxygen -He has received 2 days of IV remdesivir in the hospital and will receive 3 more days in the outpatient clinic -He will continue steroids at home  Diabetes mellitus type 2 -His blood sugars are uncontrolled due to steroid use for treatment of above -I have prescribed glipizide which he will take at home only if his sugars are elevated greater than 200-I have giving him instructions on how and when to take this medication -He will check his sugars carefully over the next week and if significantly elevated, he  is advised to call his primary care physician for further instructions  AKI on CKD stage III -The patient's HCTZ and lisinopril were held and will continue to be held for another 5 days -His creatinine improved from 1.79-1.58 -A renal ultrasound was ordered on admission and was found to be unremarkable-report is below     Discharge Exam: Vitals:   06/30/19 0400 06/30/19 0837  BP: 107/73 (!) 139/91  Pulse: 62 65  Resp: 18   Temp: 97.7 F (36.5 C)   SpO2: 95%    Vitals:   06/29/19 2000 06/30/19 0000 06/30/19 0400 06/30/19 0837  BP: 106/83 100/72 107/73 (!) 139/91  Pulse: 76 67 62 65  Resp: 19 14 18    Temp: 98 F (36.7 C) 97.6 F (36.4 C) 97.7 F (36.5 C)   TempSrc: Oral Oral Axillary   SpO2: 93% 95% 95%   Weight:      Height:        General: Pt is alert, awake, not in acute distress Cardiovascular: RRR, S1/S2 +, no rubs, no gallops Respiratory: faint b/l crackles, no wheezing, no rhonchi Abdominal: Soft, NT, ND, bowel sounds + Extremities: no edema, no cyanosis   Discharge Instructions  Discharge Instructions    Diet - low sodium heart healthy   Complete by: As directed    Diet Carb Modified   Complete by: As directed    Increase activity slowly   Complete by: As directed      Allergies as of 06/30/2019   No Known Allergies  Medication List    STOP taking these medications   lisinopril-hydrochlorothiazide 20-12.5 MG tablet Commonly known as: ZESTORETIC     TAKE these medications   acetaminophen 325 MG tablet Commonly known as: TYLENOL Take 2 tablets (650 mg total) by mouth every 6 (six) hours as needed for mild pain or headache (fever >/= 101).   allopurinol 100 MG tablet Commonly known as: ZYLOPRIM Take 100 mg by mouth daily.   carvedilol 6.25 MG tablet Commonly known as: COREG Take 6.25 mg by mouth 2 (two) times daily with a meal.   dexamethasone 6 MG tablet Commonly known as: DECADRON Take 1 tablet (6 mg total) by mouth daily for 3  days.   glipiZIDE 5 MG tablet Commonly known as: GLUCOTROL Take 5 mg at 7 AM if sugar is > 200 and take 2.5 mg at 7 PM if sugar is > 200   guaiFENesin-dextromethorphan 100-10 MG/5ML syrup Commonly known as: ROBITUSSIN DM Take 10 mLs by mouth every 4 (four) hours as needed for cough.   Ipratropium-Albuterol 20-100 MCG/ACT Aers respimat Commonly known as: COMBIVENT Inhale 1 puff into the lungs every 6 (six) hours as needed for wheezing or shortness of breath.   lovastatin 20 MG tablet Commonly known as: MEVACOR Take 20 mg by mouth at bedtime.   METAMUCIL FIBER PO Take 1 tablet by mouth at bedtime.   metFORMIN 1000 MG tablet Commonly known as: GLUCOPHAGE Take 1,000 mg by mouth 2 (two) times daily with a meal.   multivitamin with minerals Tabs tablet Take 1 tablet by mouth daily.      Follow-up Information    Marco Collie, MD Follow up in 1 week(s).   Specialty: Family Medicine Contact information: [address] [place] [address] [phone]          No Known Allergies    US RENAL  Result Date: 06/30/2019 CLINICAL DATA:  Acute kidney injury EXAM: RENAL / URINARY TRACT ULTRASOUND COMPLETE COMPARISON:  None FINDINGS: Right Kidney: Renal measurements: 10.6 x 4.9 x 5.6 cm. = volume: 153.3 mL . Echogenicity within normal limits. No mass or hydronephrosis visualized. Left Kidney: Renal measurements: 10.6 x 5.0 x 4.2 cm = volume: 115.1 mL. Echogenicity within normal limits. No mass or hydronephrosis visualized. Bladder: Appears normal for degree of bladder distention. Other: Prostate gland measures 4.2 x 3.7 x 5.0 cm with a volume of 40.6 cc. IMPRESSION: 1. No acute abnormality. 2. No hydronephrosis. Electronically Signed   By: Kerby Moors M.D.   On: 06/30/2019 09:13   DG Chest Port 1 View  Result Date: 06/29/2019 CLINICAL DATA:  Shortness of breath, COVID-19 positive EXAM: PORTABLE CHEST 1 VIEW COMPARISON:  04/18/2011 FINDINGS: Heart size is  within normal limits. Hazy bilateral airspace opacities most pronounced within the peripheral aspect of the right upper lobe as well as within the mid to lower aspect of the left lung. No appreciable pleural fluid collection. No pneumothorax. Osseous structures appear intact. IMPRESSION: Hazy bilateral airspace opacities most pronounced within the right upper lobe as well as within the mid to lower aspect of the left lung. Findings suggestive of multifocal viral pneumonia in the setting of known COVID 19 infection. Electronically Signed   By: Davina Poke D.O.   On: 06/29/2019 12:40     The results of significant diagnostics from this hospitalization (including imaging, microbiology, ancillary and laboratory) are listed below for reference.     Microbiology: Recent Results (from the past 240 hour(s))  Blood Culture (  routine x 2)     Status: None (Preliminary result)   Collection Time: 06/29/19 11:43 AM   Specimen: BLOOD  Result Value Ref Range Status   Specimen Description BLOOD RIGHT ANTECUBITAL  Final   Special Requests   Final    BOTTLES DRAWN AEROBIC AND ANAEROBIC Blood Culture adequate volume   Culture   Final    NO GROWTH < 24 HOURS Performed at [facility], [address] [place], [place] [address]    Report Status PENDING  Incomplete  Blood Culture (routine x 2)     Status: None (Preliminary result)   Collection Time: 06/29/19 11:54 AM   Specimen: BLOOD  Result Value Ref Range Status   Specimen Description BLOOD LEFT ANTECUBITAL  Final   Special Requests   Final    BOTTLES DRAWN AEROBIC AND ANAEROBIC Blood Culture adequate volume   Culture   Final    NO GROWTH < 24 HOURS Performed at [facility], [address], [place], [place] [address]    Report Status PENDING  Incomplete     Labs: BNP (last 3 results) No results for input(s): BNP in the last 8760 hours. Basic Metabolic Panel: Recent Labs  Lab 06/29/19 1037 06/29/19 1143 06/30/19 0245  NA 138  137 138  K 3.9 3.8 3.8  CL 100 98 102  CO2 26 25 24   GLUCOSE 165* 163* 262*  BUN 34* 34* 40*  CREATININE 1.70* 1.79* 1.58*  CALCIUM 9.2 9.5 9.5  MG  --   --  2.1  PHOS  --   --  2.8   Liver Function Tests: Recent Labs  Lab 06/29/19 1143 06/30/19 0245  AST 39 58*  ALT 33 51*  ALKPHOS 53 51  BILITOT 0.8 0.9  PROT 7.1 7.1  ALBUMIN 3.2* 3.1*   No results for input(s): LIPASE, AMYLASE in the last 168 hours. No results for input(s): AMMONIA in the last 168 hours. CBC: Recent Labs  Lab 06/29/19 1037 06/30/19 0245  WBC 6.9 3.1*  NEUTROABS  --  2.5  HGB 14.5 13.9  HCT 41.3 40.8  MCV 88.4 87.4  PLT 274 287   Cardiac Enzymes: No results for input(s): CKTOTAL, CKMB, CKMBINDEX, TROPONINI in the last 168 hours. BNP: Invalid input(s): POCBNP CBG: Recent Labs  Lab 06/29/19 1643 06/29/19 2059  GLUCAP 145* 334*   D-Dimer Recent Labs    06/29/19 1143 06/30/19 0245  DDIMER 0.74* 0.47   Hgb A1c Recent Labs    06/30/19 0245  HGBA1C 6.9*   Lipid Profile Recent Labs    06/29/19 1107  TRIG 139   Thyroid function studies No results for input(s): TSH, T4TOTAL, T3FREE, THYROIDAB in the last 72 hours.  Invalid input(s): FREET3 Anemia work up Recent Labs    06/29/19 1143 06/30/19 0245  FERRITIN 449* 455*   Urinalysis    Component Value Date/Time   COLORURINE YELLOW 06/29/2019 1451   APPEARANCEUR CLEAR 06/29/2019 1451   LABSPEC 1.020 06/29/2019 1451   PHURINE 5.0 06/29/2019 1451   GLUCOSEU NEGATIVE 06/29/2019 1451   HGBUR NEGATIVE 06/29/2019 1451   BILIRUBINUR NEGATIVE 06/29/2019 1451   KETONESUR 5 (A) 06/29/2019 1451   PROTEINUR 30 (A) 06/29/2019 1451   NITRITE NEGATIVE 06/29/2019 1451   LEUKOCYTESUR NEGATIVE 06/29/2019 1451   Sepsis Labs Invalid input(s): PROCALCITONIN,  WBC,  LACTICIDVEN Microbiology Recent Results (from the past 240 hour(s))  Blood Culture (routine x 2)     Status: None (Preliminary result)   Collection  Time: 06/29/19 11:43 AM    Specimen: BLOOD  Result Value Ref Range Status   Specimen Description BLOOD RIGHT ANTECUBITAL  Final   Special Requests   Final    BOTTLES DRAWN AEROBIC AND ANAEROBIC Blood Culture adequate volume   Culture   Final    NO GROWTH < 24 HOURS Performed at [facility], [address] [place], [place] [address]    Report Status PENDING  Incomplete  Blood Culture (routine x 2)     Status: None (Preliminary result)   Collection Time: 06/29/19 11:54 AM   Specimen: BLOOD  Result Value Ref Range Status   Specimen Description BLOOD LEFT ANTECUBITAL  Final   Special Requests   Final    BOTTLES DRAWN AEROBIC AND ANAEROBIC Blood Culture adequate volume   Culture   Final    NO GROWTH < 24 HOURS Performed at [facility], [address], [place], [place] [address]    Report Status PENDING  Incomplete     Time coordinating discharge in minutes: 65  SIGNED:   Calvert Cantor, MD  [facility] 06/30/2019, 10:41 AM

## 2019-07-01 ENCOUNTER — Ambulatory Visit (HOSPITAL_COMMUNITY)
Admission: RE | Admit: 2019-07-01 | Discharge: 2019-07-01 | Disposition: A | Discharge status: Home / Self Care | Payer: Medicare HMO | Source: Ambulatory Visit | Attending: Pulmonary Disease | Admitting: Pulmonary Disease

## 2019-07-01 DIAGNOSIS — J1282 Pneumonia due to coronavirus disease 2019: Secondary | ICD-10-CM | POA: Insufficient documentation

## 2019-07-01 DIAGNOSIS — U071 COVID-19: Secondary | ICD-10-CM | POA: Insufficient documentation

## 2019-07-01 MED ORDER — ALBUTEROL SULFATE HFA 108 (90 BASE) MCG/ACT IN AERS: 2.0000 | INHALATION_SPRAY | Freq: Once | RESPIRATORY_TRACT | Status: DC | PRN

## 2019-07-01 MED ORDER — SODIUM CHLORIDE 0.9 % IV SOLN: INTRAVENOUS | Status: DC | PRN

## 2019-07-01 MED ORDER — DIPHENHYDRAMINE HCL 50 MG/ML IJ SOLN: 50.0000 mg | Freq: Once | INTRAMUSCULAR | Status: DC | PRN

## 2019-07-01 MED ORDER — SODIUM CHLORIDE 0.9 % IV SOLN
100.0000 mg | Freq: Once | INTRAVENOUS | Status: AC
  Administered 2019-07-01: 100 mg via INTRAVENOUS
  Filled 2019-07-01: qty 20

## 2019-07-01 MED ORDER — EPINEPHRINE 0.3 MG/0.3ML IJ SOAJ: 0.3000 mg | Freq: Once | INTRAMUSCULAR | Status: DC | PRN

## 2019-07-01 MED ORDER — METHYLPREDNISOLONE SODIUM SUCC 125 MG IJ SOLR: 125.0000 mg | Freq: Once | INTRAMUSCULAR | Status: DC | PRN

## 2019-07-01 MED ORDER — FAMOTIDINE IN NACL 20-0.9 MG/50ML-% IV SOLN: 20.0000 mg | Freq: Once | INTRAVENOUS | Status: DC | PRN

## 2019-07-01 NOTE — Progress Notes (Addendum)
  Diagnosis: COVID-19  Physician: Dr. Delford Field  Procedure: [facility] Med: remdesivir infusion.  Complications: No immediate complications noted.  Discharge: Discharged home   Wayne Marshall 07/01/2019

## 2019-07-02 ENCOUNTER — Ambulatory Visit (HOSPITAL_COMMUNITY)
Admission: RE | Admit: 2019-07-02 | Discharge: 2019-07-02 | Disposition: A | Discharge status: Home / Self Care | Payer: Medicare HMO | Source: Ambulatory Visit | Attending: Pulmonary Disease | Admitting: Pulmonary Disease

## 2019-07-02 ENCOUNTER — Encounter (HOSPITAL_COMMUNITY): Payer: Self-pay

## 2019-07-02 DIAGNOSIS — U071 COVID-19: Secondary | ICD-10-CM | POA: Diagnosis not present

## 2019-07-02 DIAGNOSIS — J1289 Other viral pneumonia: Secondary | ICD-10-CM | POA: Insufficient documentation

## 2019-07-02 MED ORDER — ALBUTEROL SULFATE HFA 108 (90 BASE) MCG/ACT IN AERS: 2.0000 | INHALATION_SPRAY | Freq: Once | RESPIRATORY_TRACT | Status: DC | PRN

## 2019-07-02 MED ORDER — FAMOTIDINE IN NACL 20-0.9 MG/50ML-% IV SOLN: 20.0000 mg | Freq: Once | INTRAVENOUS | Status: DC | PRN

## 2019-07-02 MED ORDER — DIPHENHYDRAMINE HCL 50 MG/ML IJ SOLN: 50.0000 mg | Freq: Once | INTRAMUSCULAR | Status: DC | PRN

## 2019-07-02 MED ORDER — SODIUM CHLORIDE 0.9 % IV SOLN
100.0000 mg | Freq: Once | INTRAVENOUS | Status: AC
  Administered 2019-07-02: 100 mg via INTRAVENOUS
  Filled 2019-07-02: qty 20

## 2019-07-02 MED ORDER — METHYLPREDNISOLONE SODIUM SUCC 125 MG IJ SOLR: 125.0000 mg | Freq: Once | INTRAMUSCULAR | Status: DC | PRN

## 2019-07-02 MED ORDER — EPINEPHRINE 0.3 MG/0.3ML IJ SOAJ: 0.3000 mg | Freq: Once | INTRAMUSCULAR | Status: DC | PRN

## 2019-07-02 MED ORDER — SODIUM CHLORIDE 0.9 % IV SOLN: INTRAVENOUS | Status: DC | PRN

## 2019-07-02 NOTE — Progress Notes (Signed)
  Diagnosis: COVID-19  Physician: Dr. Wright   Procedure: [facility] Infusion Clinic Med: remdesivir infusion.  Complications: No immediate complications noted.  Discharge: Discharged home   Wayne Marshall [name] 07/02/2019  

## 2019-07-03 ENCOUNTER — Ambulatory Visit (HOSPITAL_COMMUNITY)
Admit: 2019-07-03 | Discharge: 2019-07-03 | Disposition: A | Discharge status: Home / Self Care | Payer: Medicare HMO | Attending: Pulmonary Disease | Admitting: Pulmonary Disease

## 2019-07-03 DIAGNOSIS — J1289 Other viral pneumonia: Secondary | ICD-10-CM | POA: Diagnosis not present

## 2019-07-03 DIAGNOSIS — U071 COVID-19: Secondary | ICD-10-CM | POA: Diagnosis not present

## 2019-07-03 MED ORDER — DIPHENHYDRAMINE HCL 50 MG/ML IJ SOLN: 50.0000 mg | Freq: Once | INTRAMUSCULAR | Status: DC | PRN

## 2019-07-03 MED ORDER — SODIUM CHLORIDE 0.9 % IV SOLN
100.0000 mg | Freq: Once | INTRAVENOUS | Status: AC
  Administered 2019-07-03: 100 mg via INTRAVENOUS
  Filled 2019-07-03: qty 20

## 2019-07-03 MED ORDER — FAMOTIDINE IN NACL 20-0.9 MG/50ML-% IV SOLN: 20.0000 mg | Freq: Once | INTRAVENOUS | Status: DC | PRN

## 2019-07-03 MED ORDER — SODIUM CHLORIDE 0.9 % IV SOLN: INTRAVENOUS | Status: DC | PRN

## 2019-07-03 MED ORDER — ALBUTEROL SULFATE HFA 108 (90 BASE) MCG/ACT IN AERS: 2.0000 | INHALATION_SPRAY | Freq: Once | RESPIRATORY_TRACT | Status: DC | PRN

## 2019-07-03 MED ORDER — METHYLPREDNISOLONE SODIUM SUCC 125 MG IJ SOLR: 125.0000 mg | Freq: Once | INTRAMUSCULAR | Status: DC | PRN

## 2019-07-03 MED ORDER — EPINEPHRINE 0.3 MG/0.3ML IJ SOAJ: 0.3000 mg | Freq: Once | INTRAMUSCULAR | Status: DC | PRN

## 2019-07-03 NOTE — Progress Notes (Signed)
  Diagnosis: COVID-19  Physician: Dr. Wright  Procedure: [facility]fusion Clinic Med: remdesivir infusion.  Complications: No immediate complications noted.  Discharge: Discharged home   Wayne Marshall 07/03/2019  

## 2019-07-04 LAB — CULTURE, BLOOD (ROUTINE X 2)
Culture: NO GROWTH
Culture: NO GROWTH
Special Requests: ADEQUATE
Special Requests: ADEQUATE

## 2019-07-05 DIAGNOSIS — Z7689 Persons encountering health services in other specified circumstances: Secondary | ICD-10-CM | POA: Diagnosis not present

## 2019-07-05 DIAGNOSIS — U071 COVID-19: Secondary | ICD-10-CM | POA: Diagnosis not present

## 2019-07-05 DIAGNOSIS — E1129 Type 2 diabetes mellitus with other diabetic kidney complication: Secondary | ICD-10-CM | POA: Diagnosis not present

## 2019-08-05 ENCOUNTER — Other Ambulatory Visit: Payer: Self-pay

## 2019-08-10 DIAGNOSIS — R972 Elevated prostate specific antigen [PSA]: Secondary | ICD-10-CM | POA: Diagnosis not present

## 2019-08-10 DIAGNOSIS — N401 Enlarged prostate with lower urinary tract symptoms: Secondary | ICD-10-CM | POA: Diagnosis not present

## 2019-09-07 DIAGNOSIS — E113293 Type 2 diabetes mellitus with mild nonproliferative diabetic retinopathy without macular edema, bilateral: Secondary | ICD-10-CM | POA: Diagnosis not present

## 2019-09-07 DIAGNOSIS — Z0100 Encounter for examination of eyes and vision without abnormal findings: Secondary | ICD-10-CM | POA: Diagnosis not present

## 2019-09-07 DIAGNOSIS — H25813 Combined forms of age-related cataract, bilateral: Secondary | ICD-10-CM | POA: Diagnosis not present

## 2019-09-29 DIAGNOSIS — E782 Mixed hyperlipidemia: Secondary | ICD-10-CM | POA: Diagnosis not present

## 2019-09-29 DIAGNOSIS — E1129 Type 2 diabetes mellitus with other diabetic kidney complication: Secondary | ICD-10-CM | POA: Diagnosis not present

## 2019-10-06 DIAGNOSIS — Z136 Encounter for screening for cardiovascular disorders: Secondary | ICD-10-CM | POA: Diagnosis not present

## 2019-10-06 DIAGNOSIS — Z139 Encounter for screening, unspecified: Secondary | ICD-10-CM | POA: Diagnosis not present

## 2019-10-06 DIAGNOSIS — Z0000 Encounter for general adult medical examination without abnormal findings: Secondary | ICD-10-CM | POA: Diagnosis not present

## 2019-10-06 DIAGNOSIS — Z1331 Encounter for screening for depression: Secondary | ICD-10-CM | POA: Diagnosis not present

## 2019-10-06 DIAGNOSIS — Z1339 Encounter for screening examination for other mental health and behavioral disorders: Secondary | ICD-10-CM | POA: Diagnosis not present

## 2019-10-06 DIAGNOSIS — E1129 Type 2 diabetes mellitus with other diabetic kidney complication: Secondary | ICD-10-CM | POA: Diagnosis not present

## 2019-10-06 DIAGNOSIS — E782 Mixed hyperlipidemia: Secondary | ICD-10-CM | POA: Diagnosis not present

## 2019-10-06 DIAGNOSIS — Z7189 Other specified counseling: Secondary | ICD-10-CM | POA: Diagnosis not present

## 2019-10-06 DIAGNOSIS — I10 Essential (primary) hypertension: Secondary | ICD-10-CM | POA: Diagnosis not present

## 2020-02-02 DIAGNOSIS — E1169 Type 2 diabetes mellitus with other specified complication: Secondary | ICD-10-CM | POA: Diagnosis not present

## 2020-02-02 DIAGNOSIS — Z125 Encounter for screening for malignant neoplasm of prostate: Secondary | ICD-10-CM | POA: Diagnosis not present

## 2020-02-09 DIAGNOSIS — E1169 Type 2 diabetes mellitus with other specified complication: Secondary | ICD-10-CM | POA: Diagnosis not present

## 2020-02-09 DIAGNOSIS — E1129 Type 2 diabetes mellitus with other diabetic kidney complication: Secondary | ICD-10-CM | POA: Diagnosis not present

## 2020-02-09 DIAGNOSIS — I10 Essential (primary) hypertension: Secondary | ICD-10-CM | POA: Diagnosis not present

## 2020-02-09 DIAGNOSIS — E785 Hyperlipidemia, unspecified: Secondary | ICD-10-CM | POA: Diagnosis not present

## 2020-02-09 DIAGNOSIS — Z23 Encounter for immunization: Secondary | ICD-10-CM | POA: Diagnosis not present

## 2020-06-12 DIAGNOSIS — E1169 Type 2 diabetes mellitus with other specified complication: Secondary | ICD-10-CM | POA: Diagnosis not present

## 2020-06-19 ENCOUNTER — Encounter (HOSPITAL_COMMUNITY): Payer: Self-pay | Admitting: Internal Medicine

## 2020-06-19 ENCOUNTER — Inpatient Hospital Stay (HOSPITAL_COMMUNITY)
Admission: EM | Admit: 2020-06-19 | Discharge: 2020-06-21 | DRG: 247 | Disposition: A | Discharge status: Home / Self Care | Payer: Medicare HMO | Attending: Interventional Cardiology | Admitting: Interventional Cardiology

## 2020-06-19 ENCOUNTER — Encounter (HOSPITAL_COMMUNITY)
Admission: EM | Disposition: A | Discharge status: Home / Self Care | Payer: Self-pay | Source: Home / Self Care | Attending: Internal Medicine

## 2020-06-19 ENCOUNTER — Inpatient Hospital Stay (HOSPITAL_COMMUNITY): Payer: Medicare HMO

## 2020-06-19 DIAGNOSIS — E119 Type 2 diabetes mellitus without complications: Secondary | ICD-10-CM

## 2020-06-19 DIAGNOSIS — R079 Chest pain, unspecified: Secondary | ICD-10-CM | POA: Diagnosis not present

## 2020-06-19 DIAGNOSIS — I213 ST elevation (STEMI) myocardial infarction of unspecified site: Secondary | ICD-10-CM

## 2020-06-19 DIAGNOSIS — N1831 Chronic kidney disease, stage 3a: Secondary | ICD-10-CM | POA: Diagnosis present

## 2020-06-19 DIAGNOSIS — I129 Hypertensive chronic kidney disease with stage 1 through stage 4 chronic kidney disease, or unspecified chronic kidney disease: Secondary | ICD-10-CM | POA: Diagnosis present

## 2020-06-19 DIAGNOSIS — I2584 Coronary atherosclerosis due to calcified coronary lesion: Secondary | ICD-10-CM | POA: Diagnosis present

## 2020-06-19 DIAGNOSIS — E785 Hyperlipidemia, unspecified: Secondary | ICD-10-CM | POA: Diagnosis present

## 2020-06-19 DIAGNOSIS — E1159 Type 2 diabetes mellitus with other circulatory complications: Secondary | ICD-10-CM

## 2020-06-19 DIAGNOSIS — Z20822 Contact with and (suspected) exposure to covid-19: Secondary | ICD-10-CM | POA: Insufficient documentation

## 2020-06-19 DIAGNOSIS — I2111 ST elevation (STEMI) myocardial infarction involving right coronary artery: Secondary | ICD-10-CM | POA: Diagnosis present

## 2020-06-19 DIAGNOSIS — R55 Syncope and collapse: Secondary | ICD-10-CM | POA: Diagnosis not present

## 2020-06-19 DIAGNOSIS — E1122 Type 2 diabetes mellitus with diabetic chronic kidney disease: Secondary | ICD-10-CM | POA: Diagnosis present

## 2020-06-19 DIAGNOSIS — I25119 Atherosclerotic heart disease of native coronary artery with unspecified angina pectoris: Secondary | ICD-10-CM | POA: Diagnosis present

## 2020-06-19 DIAGNOSIS — I2510 Atherosclerotic heart disease of native coronary artery without angina pectoris: Secondary | ICD-10-CM

## 2020-06-19 DIAGNOSIS — E782 Mixed hyperlipidemia: Secondary | ICD-10-CM | POA: Diagnosis not present

## 2020-06-19 DIAGNOSIS — Z8616 Personal history of COVID-19: Secondary | ICD-10-CM | POA: Diagnosis not present

## 2020-06-19 DIAGNOSIS — R0789 Other chest pain: Secondary | ICD-10-CM | POA: Diagnosis not present

## 2020-06-19 DIAGNOSIS — I10 Essential (primary) hypertension: Secondary | ICD-10-CM | POA: Diagnosis not present

## 2020-06-19 DIAGNOSIS — I499 Cardiac arrhythmia, unspecified: Secondary | ICD-10-CM | POA: Diagnosis not present

## 2020-06-19 DIAGNOSIS — Z79899 Other long term (current) drug therapy: Secondary | ICD-10-CM

## 2020-06-19 DIAGNOSIS — Z955 Presence of coronary angioplasty implant and graft: Secondary | ICD-10-CM

## 2020-06-19 DIAGNOSIS — Z7984 Long term (current) use of oral hypoglycemic drugs: Secondary | ICD-10-CM | POA: Diagnosis not present

## 2020-06-19 DIAGNOSIS — N183 Chronic kidney disease, stage 3 unspecified: Secondary | ICD-10-CM | POA: Diagnosis not present

## 2020-06-19 DIAGNOSIS — Z8673 Personal history of transient ischemic attack (TIA), and cerebral infarction without residual deficits: Secondary | ICD-10-CM | POA: Diagnosis not present

## 2020-06-19 HISTORY — DX: Cerebral infarction, unspecified: I63.9

## 2020-06-19 HISTORY — PX: LEFT HEART CATH AND CORONARY ANGIOGRAPHY: CATH118249

## 2020-06-19 HISTORY — DX: Chronic kidney disease, unspecified: N18.9

## 2020-06-19 HISTORY — DX: ST elevation (STEMI) myocardial infarction of unspecified site: I21.3

## 2020-06-19 HISTORY — DX: ST elevation (STEMI) myocardial infarction involving right coronary artery: I21.11

## 2020-06-19 HISTORY — PX: CORONARY STENT INTERVENTION: CATH118234

## 2020-06-19 LAB — POCT I-STAT, CHEM 8
BUN: 20 mg/dL (ref 8–23)
Calcium, Ion: 1.25 mmol/L (ref 1.15–1.40)
Chloride: 103 mmol/L (ref 98–111)
Creatinine, Ser: 1 mg/dL (ref 0.61–1.24)
Glucose, Bld: 206 mg/dL — ABNORMAL HIGH (ref 70–99)
HCT: 39 % (ref 39.0–52.0)
Hemoglobin: 13.3 g/dL (ref 13.0–17.0)
Potassium: 3.2 mmol/L — ABNORMAL LOW (ref 3.5–5.1)
Sodium: 137 mmol/L (ref 135–145)
TCO2: 21 mmol/L — ABNORMAL LOW (ref 22–32)

## 2020-06-19 LAB — CBC WITH DIFFERENTIAL/PLATELET
Abs Immature Granulocytes: 0.04 10*3/uL (ref 0.00–0.07)
Basophils Absolute: 0 10*3/uL (ref 0.0–0.1)
Basophils Relative: 0 %
Eosinophils Absolute: 0 10*3/uL (ref 0.0–0.5)
Eosinophils Relative: 0 %
HCT: 38.5 % — ABNORMAL LOW (ref 39.0–52.0)
Hemoglobin: 14 g/dL (ref 13.0–17.0)
Immature Granulocytes: 0 %
Lymphocytes Relative: 10 %
Lymphs Abs: 1 10*3/uL (ref 0.7–4.0)
MCH: 31.7 pg (ref 26.0–34.0)
MCHC: 36.4 g/dL — ABNORMAL HIGH (ref 30.0–36.0)
MCV: 87.3 fL (ref 80.0–100.0)
Monocytes Absolute: 0.4 10*3/uL (ref 0.1–1.0)
Monocytes Relative: 4 %
Neutro Abs: 8.3 10*3/uL — ABNORMAL HIGH (ref 1.7–7.7)
Neutrophils Relative %: 86 %
Platelets: 283 10*3/uL (ref 150–400)
RBC: 4.41 MIL/uL (ref 4.22–5.81)
RDW: 13.3 % (ref 11.5–15.5)
WBC: 9.7 10*3/uL (ref 4.0–10.5)
nRBC: 0 % (ref 0.0–0.2)

## 2020-06-19 LAB — HEMOGLOBIN A1C
Hgb A1c MFr Bld: 6.2 % — ABNORMAL HIGH (ref 4.8–5.6)
Mean Plasma Glucose: 131.24 mg/dL

## 2020-06-19 LAB — TROPONIN I (HIGH SENSITIVITY): Troponin I (High Sensitivity): 26335 ng/L (ref <18)

## 2020-06-19 LAB — COMPREHENSIVE METABOLIC PANEL
ALT: 26 U/L (ref 0–44)
AST: 81 U/L — ABNORMAL HIGH (ref 15–41)
Albumin: 3.4 g/dL — ABNORMAL LOW (ref 3.5–5.0)
Alkaline Phosphatase: 58 U/L (ref 38–126)
Anion gap: 12 (ref 5–15)
BUN: 17 mg/dL (ref 8–23)
CO2: 24 mmol/L (ref 22–32)
Calcium: 9.3 mg/dL (ref 8.9–10.3)
Chloride: 102 mmol/L (ref 98–111)
Creatinine, Ser: 1.24 mg/dL (ref 0.61–1.24)
GFR, Estimated: >60 mL/min (ref >60)
Glucose, Bld: 184 mg/dL — ABNORMAL HIGH (ref 70–99)
Potassium: 3.6 mmol/L (ref 3.5–5.1)
Sodium: 138 mmol/L (ref 135–145)
Total Bilirubin: 0.8 mg/dL (ref 0.3–1.2)
Total Protein: 6.2 g/dL — ABNORMAL LOW (ref 6.5–8.1)

## 2020-06-19 LAB — LIPID PANEL
Cholesterol: 134 mg/dL (ref 0–200)
HDL: 39 mg/dL — ABNORMAL LOW (ref >40)
LDL Cholesterol: 73 mg/dL (ref 0–99)
Total CHOL/HDL Ratio: 3.4 RATIO
Triglycerides: 108 mg/dL (ref <150)
VLDL: 22 mg/dL (ref 0–40)

## 2020-06-19 LAB — GLUCOSE, CAPILLARY
Glucose-Capillary: 160 mg/dL — ABNORMAL HIGH (ref 70–99)
Glucose-Capillary: 190 mg/dL — ABNORMAL HIGH (ref 70–99)

## 2020-06-19 LAB — TSH: TSH: 1.989 u[IU]/mL (ref 0.350–4.500)

## 2020-06-19 LAB — ECHOCARDIOGRAM COMPLETE
Area-P 1/2: 4.39 cm2
Calc EF: 53.9 %
S' Lateral: 3.4 cm
Single Plane A2C EF: 52 %
Single Plane A4C EF: 54.8 %

## 2020-06-19 LAB — POCT ACTIVATED CLOTTING TIME
Activated Clotting Time: 255 seconds
Activated Clotting Time: 297 seconds

## 2020-06-19 LAB — SARS CORONAVIRUS 2 (TAT 6-24 HRS): SARS Coronavirus 2: NEGATIVE

## 2020-06-19 SURGERY — LEFT HEART CATH AND CORONARY ANGIOGRAPHY
Anesthesia: LOCAL

## 2020-06-19 MED ORDER — SODIUM CHLORIDE 0.9% FLUSH
3.0000 mL | Freq: Two times a day (BID) | INTRAVENOUS | Status: DC
  Administered 2020-06-19 – 2020-06-20 (×3): 3 mL via INTRAVENOUS

## 2020-06-19 MED ORDER — CHLORHEXIDINE GLUCONATE CLOTH 2 % EX PADS: 6.0000 | MEDICATED_PAD | Freq: Every day | CUTANEOUS | Status: DC

## 2020-06-19 MED ORDER — ENOXAPARIN SODIUM 40 MG/0.4ML ~~LOC~~ SOLN
40.0000 mg | SUBCUTANEOUS | Status: DC
  Administered 2020-06-20: 40 mg via SUBCUTANEOUS
  Filled 2020-06-19: qty 0.4

## 2020-06-19 MED ORDER — LABETALOL HCL 5 MG/ML IV SOLN: 10.0000 mg | INTRAVENOUS | Status: AC | PRN

## 2020-06-19 MED ORDER — HYDRALAZINE HCL 20 MG/ML IJ SOLN: 10.0000 mg | INTRAMUSCULAR | Status: AC | PRN

## 2020-06-19 MED ORDER — HEPARIN (PORCINE) IN NACL 1000-0.9 UT/500ML-% IV SOLN
INTRAVENOUS | Status: DC | PRN
  Administered 2020-06-19 (×2): 500 mL

## 2020-06-19 MED ORDER — HEPARIN SODIUM (PORCINE) 1000 UNIT/ML IJ SOLN
INTRAMUSCULAR | Status: DC | PRN
  Administered 2020-06-19: 3000 [IU] via INTRAVENOUS
  Administered 2020-06-19: 3500 [IU] via INTRAVENOUS
  Administered 2020-06-19: 4000 [IU] via INTRAVENOUS

## 2020-06-19 MED ORDER — IOHEXOL 350 MG/ML SOLN
INTRAVENOUS | Status: DC | PRN
  Administered 2020-06-19: 70 mL via INTRA_ARTERIAL

## 2020-06-19 MED ORDER — NITROGLYCERIN 1 MG/10 ML FOR IR/CATH LAB
INTRA_ARTERIAL | Status: DC | PRN
  Administered 2020-06-19 (×3): 200 ug via INTRACORONARY

## 2020-06-19 MED ORDER — MIDAZOLAM HCL 2 MG/2ML IJ SOLN
INTRAMUSCULAR | Status: DC | PRN
  Administered 2020-06-19: 1 mg via INTRAVENOUS

## 2020-06-19 MED ORDER — CANGRELOR TETRASODIUM 50 MG IV SOLR
INTRAVENOUS | Status: AC
  Filled 2020-06-19: qty 50

## 2020-06-19 MED ORDER — INSULIN ASPART 100 UNIT/ML ~~LOC~~ SOLN: 0.0000 [IU] | Freq: Every day | SUBCUTANEOUS | Status: DC

## 2020-06-19 MED ORDER — HEPARIN SODIUM (PORCINE) 1000 UNIT/ML IJ SOLN
INTRAMUSCULAR | Status: AC
  Filled 2020-06-19: qty 1

## 2020-06-19 MED ORDER — SODIUM CHLORIDE 0.9 % IV SOLN
4.0000 ug/kg/min | INTRAVENOUS | Status: AC
  Administered 2020-06-19: 4 ug/kg/min via INTRAVENOUS
  Filled 2020-06-19: qty 50

## 2020-06-19 MED ORDER — TICAGRELOR 90 MG PO TABS
ORAL_TABLET | ORAL | Status: DC | PRN
  Administered 2020-06-19: 180 mg via ORAL

## 2020-06-19 MED ORDER — IOHEXOL 350 MG/ML SOLN
INTRAVENOUS | Status: AC
  Filled 2020-06-19: qty 1

## 2020-06-19 MED ORDER — VERAPAMIL HCL 2.5 MG/ML IV SOLN
INTRAVENOUS | Status: DC | PRN
  Administered 2020-06-19: 10 mL via INTRA_ARTERIAL

## 2020-06-19 MED ORDER — CARVEDILOL 6.25 MG PO TABS
6.2500 mg | ORAL_TABLET | Freq: Two times a day (BID) | ORAL | Status: DC
  Administered 2020-06-19 – 2020-06-21 (×3): 6.25 mg via ORAL
  Filled 2020-06-19 (×3): qty 1
  Filled 2020-06-19: qty 2
  Filled 2020-06-19: qty 1

## 2020-06-19 MED ORDER — VERAPAMIL HCL 2.5 MG/ML IV SOLN
INTRAVENOUS | Status: AC
  Filled 2020-06-19: qty 2

## 2020-06-19 MED ORDER — ASPIRIN 81 MG PO CHEW
81.0000 mg | CHEWABLE_TABLET | Freq: Every day | ORAL | Status: DC
  Administered 2020-06-20 – 2020-06-21 (×2): 81 mg via ORAL
  Filled 2020-06-19 (×2): qty 1

## 2020-06-19 MED ORDER — SODIUM CHLORIDE 0.9 % IV SOLN: 250.0000 mL | INTRAVENOUS | Status: DC | PRN

## 2020-06-19 MED ORDER — TICAGRELOR 90 MG PO TABS
90.0000 mg | ORAL_TABLET | Freq: Two times a day (BID) | ORAL | Status: DC
  Administered 2020-06-19 – 2020-06-21 (×4): 90 mg via ORAL
  Filled 2020-06-19 (×4): qty 1

## 2020-06-19 MED ORDER — ONDANSETRON HCL 4 MG/2ML IJ SOLN: 4.0000 mg | Freq: Four times a day (QID) | INTRAMUSCULAR | Status: DC | PRN

## 2020-06-19 MED ORDER — LIDOCAINE HCL (PF) 1 % IJ SOLN
INTRAMUSCULAR | Status: AC
  Filled 2020-06-19: qty 30

## 2020-06-19 MED ORDER — ACETAMINOPHEN 325 MG PO TABS
650.0000 mg | ORAL_TABLET | ORAL | Status: DC | PRN
  Filled 2020-06-19: qty 2

## 2020-06-19 MED ORDER — ATORVASTATIN CALCIUM 80 MG PO TABS
80.0000 mg | ORAL_TABLET | Freq: Every day | ORAL | Status: DC
  Administered 2020-06-19 – 2020-06-21 (×3): 80 mg via ORAL
  Filled 2020-06-19 (×3): qty 1

## 2020-06-19 MED ORDER — SODIUM CHLORIDE 0.9 % IV SOLN
INTRAVENOUS | Status: AC | PRN
  Administered 2020-06-19: 4 ug/kg/min via INTRAVENOUS

## 2020-06-19 MED ORDER — FENTANYL CITRATE (PF) 100 MCG/2ML IJ SOLN
INTRAMUSCULAR | Status: AC
  Filled 2020-06-19: qty 2

## 2020-06-19 MED ORDER — MIDAZOLAM HCL 2 MG/2ML IJ SOLN
INTRAMUSCULAR | Status: AC
  Filled 2020-06-19: qty 2

## 2020-06-19 MED ORDER — HEPARIN (PORCINE) IN NACL 1000-0.9 UT/500ML-% IV SOLN
INTRAVENOUS | Status: AC
  Filled 2020-06-19: qty 1000

## 2020-06-19 MED ORDER — FENTANYL CITRATE (PF) 100 MCG/2ML IJ SOLN
INTRAMUSCULAR | Status: DC | PRN
  Administered 2020-06-19: 25 ug via INTRAVENOUS

## 2020-06-19 MED ORDER — TICAGRELOR 90 MG PO TABS
ORAL_TABLET | ORAL | Status: AC
  Filled 2020-06-19: qty 2

## 2020-06-19 MED ORDER — CANGRELOR BOLUS VIA INFUSION
INTRAVENOUS | Status: DC | PRN
  Administered 2020-06-19: 2340 ug via INTRAVENOUS

## 2020-06-19 MED ORDER — SODIUM CHLORIDE 0.9% FLUSH: 3.0000 mL | INTRAVENOUS | Status: DC | PRN

## 2020-06-19 MED ORDER — LIDOCAINE HCL (PF) 1 % IJ SOLN
INTRAMUSCULAR | Status: DC | PRN
  Administered 2020-06-19: 2 mL

## 2020-06-19 MED ORDER — INSULIN ASPART 100 UNIT/ML ~~LOC~~ SOLN
0.0000 [IU] | Freq: Three times a day (TID) | SUBCUTANEOUS | Status: DC
  Administered 2020-06-19: 3 [IU] via SUBCUTANEOUS
  Administered 2020-06-20 – 2020-06-21 (×3): 2 [IU] via SUBCUTANEOUS

## 2020-06-19 MED ORDER — ALLOPURINOL 100 MG PO TABS
100.0000 mg | ORAL_TABLET | Freq: Every day | ORAL | Status: DC
  Administered 2020-06-19 – 2020-06-21 (×3): 100 mg via ORAL
  Filled 2020-06-19 (×4): qty 1

## 2020-06-19 SURGICAL SUPPLY — 18 items
BALLN SAPPHIRE 2.0X12 (BALLOONS) ×2
BALLN SAPPHIRE ~~LOC~~ 2.75X12 (BALLOONS) ×4 IMPLANT
BALLOON SAPPHIRE 2.0X12 (BALLOONS) ×1 IMPLANT
CATH 5FR JL3.5 JR4 ANG PIG MP (CATHETERS) ×2 IMPLANT
CATH VISTA GUIDE 6FR JR4 (CATHETERS) ×2 IMPLANT
DEVICE RAD COMP TR BAND LRG (VASCULAR PRODUCTS) ×2 IMPLANT
ELECT DEFIB PAD ADLT CADENCE (PAD) ×2 IMPLANT
GLIDESHEATH SLEND SS 6F .021 (SHEATH) ×2 IMPLANT
GUIDEWIRE INQWIRE 1.5J.035X260 (WIRE) ×1 IMPLANT
INQWIRE 1.5J .035X260CM (WIRE) ×2
KIT ENCORE 26 ADVANTAGE (KITS) ×2 IMPLANT
KIT HEART LEFT (KITS) ×2 IMPLANT
PACK CARDIAC CATHETERIZATION (CUSTOM PROCEDURE TRAY) ×2 IMPLANT
STENT SYNERGY XD 2.50X20 (Permanent Stent) ×1 IMPLANT
SYNERGY XD 2.50X20 (Permanent Stent) ×2 IMPLANT
TRANSDUCER W/STOPCOCK (MISCELLANEOUS) ×2 IMPLANT
TUBING CIL FLEX 10 FLL-RA (TUBING) ×2 IMPLANT
WIRE RUNTHROUGH .014X180CM (WIRE) ×2 IMPLANT

## 2020-06-19 NOTE — Progress Notes (Signed)
TR BAND REMOVAL  LOCATION:    Radial rt radial  DEFLATED PER PROTOCOL:   yes  TIME BAND OFF / DRESSING APPLIED:    1430/gauze and tegaderm  SITE UPON ARRIVAL:    Level 0  SITE AFTER BAND REMOVAL:    Level 0  CIRCULATION SENSATION AND MOVEMENT:    Within Normal Limits : yes, rt hand and fingers warm and pink; 2+ palpable rt radial pulse  COMMENTS:

## 2020-06-19 NOTE — Progress Notes (Signed)
  Echocardiogram 2D Echocardiogram has been performed.  Stark Bray Swaim 06/19/2020, 1:50 PM

## 2020-06-19 NOTE — H&P (Signed)
Cardiology Admission History and Physical:   Patient ID: Wayne Marshall MRN: [id number]; DOB: 07/31/1950   Admission date: 06/19/2020  Primary Care Provider: Abner Greenspan, MD [facility] Cardiologist: [facility] Electrophysiologist:  None   Chief Complaint:  Chest pain  Patient Profile:   Wayne Marshall is a 70 y.o. male with history of hypertension, hyperlipidemia, type 2 diabetes mellitus, stroke, chronic kidney disease stage III, and COVID-19 infection (06/2019), presenting via EMS with acute onset of chest pain rating to the right arm and inferior ST elevation consistent with STEMI.  History of Present Illness:   Wayne Marshall was in his usual state of health.  He arrived at work around 430 this morning and was vacuuming when he had sudden onset of chest pain rating to the right arm.  This was associated with lightheadedness and nausea.  EMS was summoned and found him to be hypotensive with a blood pressure of 80/40.  EKG showed sinus bradycardia with inferior ST segment elevation.  He received aspirin 324 mg x 1 and has been transferred to [facility] for further evaluation.  At this time, he continues to complain of 3/10 chest pain, now radiating to the back.  He denies a history of prior heart disease.  Patient was referred for emergent cardiac catheterization.  He was found to have severe three-vessel coronary artery disease including 80% proximal LAD, 90% mid LCx, and 100% mid RCA lesions.  He underwent primary PCI to the mid RCA with resolution of chest pain.   Past Medical History:  Diagnosis Date  . Chronic kidney disease   . COVID-19 2021  . Diabetes mellitus without complication (HCC)   . Hypertension   . Stroke [facility])     History reviewed. No pertinent surgical history.   Medications Prior to Admission: Prior to Admission medications   Medication Sig Start Date Jireh Elmore Date Taking? Authorizing Provider  acetaminophen (TYLENOL) 325 MG tablet Take 2 tablets  (650 mg total) by mouth every 6 (six) hours as needed for mild pain or headache (fever >/= 101). 06/30/19   Calvert Cantor, MD  allopurinol (ZYLOPRIM) 100 MG tablet Take 100 mg by mouth daily.    [provider]  carvedilol (COREG) 6.25 MG tablet Take 6.25 mg by mouth 2 (two) times daily with a meal.    [provider]  glipiZIDE (GLUCOTROL) 5 MG tablet Take 5 mg at 7 AM if sugar is > 200 and take 2.5 mg at 7 PM if sugar is > 200 06/30/19   Wayne Marshall, Wayne Heads, MD  guaiFENesin-dextromethorphan (ROBITUSSIN DM) 100-10 MG/5ML syrup Take 10 mLs by mouth every 4 (four) hours as needed for cough. 06/30/19   Calvert Cantor, MD  Ipratropium-Albuterol (COMBIVENT) 20-100 MCG/ACT AERS respimat Inhale 1 puff into the lungs every 6 (six) hours as needed for wheezing or shortness of breath. 06/30/19   Calvert Cantor, MD  lovastatin (MEVACOR) 20 MG tablet Take 20 mg by mouth at bedtime.    [provider]  metFORMIN (GLUCOPHAGE) 1000 MG tablet Take 1,000 mg by mouth 2 (two) times daily with a meal.    [provider]  Multiple Vitamin (MULTIVITAMIN WITH MINERALS) TABS tablet Take 1 tablet by mouth daily.    [provider]  Psyllium (METAMUCIL FIBER PO) Take 1 tablet by mouth at bedtime.    [provider]     Allergies:   No Known Allergies  Social History:   Social History   Tobacco Use  . Smoking  status: Never Smoker  . Smokeless tobacco: Never Used  Vaping Use  . Vaping Use: Never used  Substance Use Topics  . Alcohol use: Never  . Drug use: Never     Family History: The patient's family history includes Cancer in his father. There is no history of Heart disease.    ROS:  Review of systems unable to be performed due to acuity of the patient's condition.  Physical Exam/Data:   Vitals:   06/19/20 0851 06/19/20 0856 06/19/20 0905 06/19/20 0910  BP:   120/64 119/74  Pulse: (!) 0 (!) 211    Resp: (!) 33 (!) 0 11 15  SpO2: (!) 0% (!) 0%  100%   No  intake or output data in the 24 hours ending 06/19/20 0934 Last 3 Weights 06/29/2019  Weight (lbs) 163 lb  Weight (kg) 73.936 kg     There is no height or weight on file to calculate BMI.  General: Uncomfortable appearing man lying on EMS stretcher. HEENT: normal Lymph: no adenopathy Neck: no JVD Endocrine:  No thryomegaly Vascular: No carotid bruits; 2+ radial pulses bilaterally. Cardiac:  normal S1, S2; RRR; no murmurs gallops Lungs:  clear to auscultation bilaterally, no wheezing, rhonchi or rales  Abd: soft, nontender, no hepatomegaly  Ext: no lower extremity edema Musculoskeletal:  No deformities, BUE and BLE strength normal and equal Skin: warm and dry  Neuro:  CNs 2-12 intact, no focal abnormalities noted Psych:  Normal affect    EKG:  The ECG that was done by EMS at 7:03 AM was personally reviewed and demonstrates with inferior ST elevation.  Relevant CV Studies: See cath results below  Laboratory Data:  High Sensitivity Troponin:  No results for input(s): TROPONINIHS in the last 720 hours.    ChemistryNo results for input(s): NA, K, CL, CO2, GLUCOSE, BUN, CREATININE, CALCIUM, GFRNONAA, GFRAA, ANIONGAP in the last 168 hours.  No results for input(s): PROT, ALBUMIN, AST, ALT, ALKPHOS, BILITOT in the last 168 hours. HematologyNo results for input(s): WBC, RBC, HGB, HCT, MCV, MCH, MCHC, RDW, PLT in the last 168 hours. BNPNo results for input(s): BNP, PROBNP in the last 168 hours.  DDimer No results for input(s): DDIMER in the last 168 hours.   Radiology/Studies:  CARDIAC CATHETERIZATION  Result Date: 06/19/2020 Conclusions: 1. Severe three-vessel coronary artery disease including 80% proximal LAD, sequential 40% ostial/proximal and 90% mid LCx, and 100% mid RCA lesions.  Culprit lesion for the patient's inferior STEMI (type 1 MI) is the mid RCA occlusion. 2. Mildly elevated left ventricular filling pressure (LVEDP 20-25 mmHg). 3. Successful primary PCI to mid RCA occlusion  using Synergy 2.5 x 20 mm drug-eluting stent with 0% residual stenosis and TIMI-3 flow. Recommendations: 1. Continue cangrelor infusion for 2 hours after administration of ticagrelor at the [place]le[place] of PCI. 2. Dual antiplatelet therapy with aspirin and ticagrelor for at least 12 months. 3. Anticipate staged PCI to proximal LAD and mid LCx prior to discharge. 4. Obtain echocardiogram; left ventriculogram not performed during this procedure given history of CKD. 5. Aggressive secondary prevention. Yvonne Kendall, MD [facility]     Assessment and Plan:   Inferior STEMI with multivessel coronary artery disease: Patient with acute onset of chest and upper back pain today with inferior ST elevation on EKG.  Catheterization showed multivessel CAD with culprit lesion being an occluded mid RCA (type I MI) now status post primary PCI.  Patient is currently chest pain-free.  Admit to 2H-ICU.  Dual antiplatelet therapy  with aspirin and ticagrelor for at least 12 months.  Continue Cangrelor infusion for 2 hours after ticagrelor load given.  Switch lovastatin to atorvastatin 80 mg daily.  Obtain echocardiogram.  Trend high-sensitivity troponin I until it has peaked, then stop.  Maintain net even to slightly negative fluid balance given mildly elevated LVEDP at the time of catheterization in the setting of chronic kidney disease.  Anticipate staged PCI to LAD and LCx prior to discharge.  Hypertension:  Monitor blood pressure post catheterization.  Consider adding ACE inhibitor/ARB as renal function tolerates prior to discharge given acute MI and history of diabetes mellitus.  Defer adding beta-blocker given resting bradycardia during and after catheterization today.  Hyperlipidemia:  Check lipid panel.  Switch lovastatin to atorvastatin 80 mg daily for target LDL less than 70.  Type 2 diabetes mellitus:  Sliding scale insulin.  Hold metformin and glipizide while inpatient.  Check  hemoglobin A1c.  Chronic kidney disease stage IIIa: Creatinines ranged from 1.6-1.8 during hospitalization the last year for COVID-19.  No other renal function noted in the computer.  Follow-up labs.  Maintain net even to slightly negative fluid balance.  I will defer post catheterization hydration in the setting of mildly elevated LVEDP and uncertain LVEF.  Avoid nephrotoxic agents.  History of stroke: No significant residual deficits.  Aggressive secondary prevention including a lipid and antiplatelet therapy.  Escalate blood pressure control for target blood pressure less than 130/80.   Risk Assessment/Risk Scores:   TIMI Risk Score for ST  Elevation MI:   The patient's TIMI risk score is 3, which indicates a 4.4% risk of all cause mortality at 30 days. {  Severity of Illness: The appropriate patient status for this patient is INPATIENT. Inpatient status is judged to be reasonable and necessary in order to provide the required intensity of service to ensure the patient's safety. The patient's presenting symptoms, physical exam findings, and initial radiographic and laboratory data in the context of their chronic comorbidities is felt to place them at high risk for further clinical deterioration. Furthermore, it is not anticipated that the patient will be medically stable for discharge from the hospital within 2 midnights of admission. The following factors support the patient status of inpatient.   " The patient's presenting symptoms include severe chest pain. " The worrisome physical exam findings include transient hypotension when EMS first arrived on scene, resolved with IV fluid. " The initial radiographic and laboratory data are worrisome because of multivessel coronary artery disease by catheterization. " The chronic co-morbidities include type 2 diabetes mellitus and chronic kidney disease.   * I certify that at the point of admission it is my clinical judgment that the  patient will require inpatient hospital care spanning beyond 2 midnights from the point of admission due to high intensity of service, high risk for further deterioration and high frequency of surveillance required.*   For questions or updates, please contact [facility] Please consult [email or web address] for contact info under   Signed, Yvonne Kendall, MD  06/19/2020 9:34 AM

## 2020-06-20 ENCOUNTER — Encounter (HOSPITAL_COMMUNITY): Payer: Self-pay | Admitting: Internal Medicine

## 2020-06-20 ENCOUNTER — Other Ambulatory Visit: Payer: Self-pay

## 2020-06-20 DIAGNOSIS — E1122 Type 2 diabetes mellitus with diabetic chronic kidney disease: Secondary | ICD-10-CM | POA: Diagnosis not present

## 2020-06-20 DIAGNOSIS — E1159 Type 2 diabetes mellitus with other circulatory complications: Secondary | ICD-10-CM | POA: Diagnosis not present

## 2020-06-20 DIAGNOSIS — N183 Chronic kidney disease, stage 3 unspecified: Secondary | ICD-10-CM | POA: Diagnosis not present

## 2020-06-20 DIAGNOSIS — E782 Mixed hyperlipidemia: Secondary | ICD-10-CM

## 2020-06-20 DIAGNOSIS — I10 Essential (primary) hypertension: Secondary | ICD-10-CM | POA: Diagnosis not present

## 2020-06-20 DIAGNOSIS — I2111 ST elevation (STEMI) myocardial infarction involving right coronary artery: Secondary | ICD-10-CM | POA: Diagnosis not present

## 2020-06-20 LAB — BASIC METABOLIC PANEL
Anion gap: 10 (ref 5–15)
BUN: 19 mg/dL (ref 8–23)
CO2: 21 mmol/L — ABNORMAL LOW (ref 22–32)
Calcium: 9.1 mg/dL (ref 8.9–10.3)
Chloride: 106 mmol/L (ref 98–111)
Creatinine, Ser: 1.31 mg/dL — ABNORMAL HIGH (ref 0.61–1.24)
GFR, Estimated: 59 mL/min — ABNORMAL LOW (ref >60)
Glucose, Bld: 231 mg/dL — ABNORMAL HIGH (ref 70–99)
Potassium: 3.7 mmol/L (ref 3.5–5.1)
Sodium: 137 mmol/L (ref 135–145)

## 2020-06-20 LAB — GLUCOSE, CAPILLARY
Glucose-Capillary: 131 mg/dL — ABNORMAL HIGH (ref 70–99)
Glucose-Capillary: 131 mg/dL — ABNORMAL HIGH (ref 70–99)
Glucose-Capillary: 119 mg/dL — ABNORMAL HIGH (ref 70–99)
Glucose-Capillary: 134 mg/dL — ABNORMAL HIGH (ref 70–99)
Glucose-Capillary: 180 mg/dL — ABNORMAL HIGH (ref 70–99)

## 2020-06-20 LAB — CBC
HCT: 37.6 % — ABNORMAL LOW (ref 39.0–52.0)
Hemoglobin: 13.2 g/dL (ref 13.0–17.0)
MCH: 30.6 pg (ref 26.0–34.0)
MCHC: 35.1 g/dL (ref 30.0–36.0)
MCV: 87 fL (ref 80.0–100.0)
Platelets: 284 10*3/uL (ref 150–400)
RBC: 4.32 MIL/uL (ref 4.22–5.81)
RDW: 13.1 % (ref 11.5–15.5)
WBC: 11.9 10*3/uL — ABNORMAL HIGH (ref 4.0–10.5)
nRBC: 0 % (ref 0.0–0.2)

## 2020-06-20 LAB — MRSA PCR SCREENING: MRSA by PCR: NEGATIVE

## 2020-06-20 LAB — TROPONIN I (HIGH SENSITIVITY): Troponin I (High Sensitivity): 22681 ng/L (ref <18)

## 2020-06-20 MED ORDER — SODIUM CHLORIDE 0.9% FLUSH
3.0000 mL | Freq: Two times a day (BID) | INTRAVENOUS | Status: DC
  Administered 2020-06-20 – 2020-06-21 (×3): 3 mL via INTRAVENOUS

## 2020-06-20 MED ORDER — ASPIRIN 81 MG PO CHEW
81.0000 mg | CHEWABLE_TABLET | ORAL | Status: AC
  Administered 2020-06-21: 81 mg via ORAL
  Filled 2020-06-20: qty 1

## 2020-06-20 MED ORDER — POTASSIUM CHLORIDE CRYS ER 20 MEQ PO TBCR
30.0000 meq | EXTENDED_RELEASE_TABLET | Freq: Once | ORAL | Status: AC
  Administered 2020-06-20: 30 meq via ORAL
  Filled 2020-06-20: qty 1

## 2020-06-20 MED ORDER — SODIUM CHLORIDE 0.9% FLUSH: 3.0000 mL | INTRAVENOUS | Status: DC | PRN

## 2020-06-20 MED ORDER — SODIUM CHLORIDE 0.9 % WEIGHT BASED INFUSION: 1.0000 mL/kg/h | INTRAVENOUS | Status: DC

## 2020-06-20 MED ORDER — SODIUM CHLORIDE 0.9 % IV SOLN: 250.0000 mL | INTRAVENOUS | Status: DC | PRN

## 2020-06-20 MED ORDER — SODIUM CHLORIDE 0.9 % WEIGHT BASED INFUSION
3.0000 mL/kg/h | INTRAVENOUS | Status: AC
  Administered 2020-06-21: 3 mL/kg/h via INTRAVENOUS

## 2020-06-20 NOTE — Progress Notes (Signed)
Pt put on cath board per Dr. Hoyle Barr request - orders written per our discussion. He discussed procedure with pt.

## 2020-06-20 NOTE — Progress Notes (Signed)
Progress Note  Patient Name: Wayne Marshall Date of Encounter: 06/20/2020  [facility] Cardiologist: No primary care provider on file. new ([place])  Subjective   No chest pain.  Feels well.   Inpatient Medications    Scheduled Meds: . allopurinol  100 mg Oral Daily  . aspirin  81 mg Oral Daily  . atorvastatin  80 mg Oral Daily  . carvedilol  6.25 mg Oral BID WC  . Chlorhexidine Gluconate Cloth  6 each Topical Daily  . enoxaparin (LOVENOX) injection  40 mg Subcutaneous Q24H  . insulin aspart  0-15 Units Subcutaneous TID WC  . insulin aspart  0-5 Units Subcutaneous QHS  . sodium chloride flush  3 mL Intravenous Q12H  . ticagrelor  90 mg Oral BID   Continuous Infusions: . sodium chloride 10 mL/hr at 06/19/20 0910   PRN Meds: sodium chloride, acetaminophen, ondansetron (ZOFRAN) IV, sodium chloride flush   Vital Signs    Vitals:   06/20/20 0800 06/20/20 0805 06/20/20 0900 06/20/20 1000  BP: (!) 138/117 127/77 126/75 128/75  Pulse: (!) 57 (!) 50 (!) 52 (!) 52  Resp: 13 13 15 12   Temp:      TempSrc:      SpO2: 100% 100% 99% 99%  Weight:      Height:        Intake/Output Summary (Last 24 hours) at 06/20/2020 1021 Last data filed at 06/20/2020 0741 Gross per 24 hour  Intake 408.86 ml  Output 850 ml  Net -441.14 ml   Last 3 Weights 06/19/2020 06/29/2019  Weight (lbs) 173 lb 163 lb  Weight (kg) 78.472 kg 73.936 kg      Telemetry    NSR - Personally Reviewed  ECG    Sinus brady, inferior Q waves- Personally Reviewed  Physical Exam   GEN: No acute distress.   Neck: No JVD Cardiac: RRR, no murmurs, rubs, or gallops.  Respiratory: Clear to auscultation bilaterally. GI: Soft, nontender, non-distended  MS: No edema; No deformity. No right radial hematoma Neuro:  Nonfocal  Psych: Normal affect   Labs    High Sensitivity Troponin:   Recent Labs  Lab 06/19/20 1630 06/20/20 0054  TROPONINIHS 26,335* 22,681*      Chemistry Recent Labs  Lab  06/19/20 0817 06/19/20 1630 06/20/20 0054  NA 137 138 137  K 3.2* 3.6 3.7  CL 103 102 106  CO2  --  24 21*  GLUCOSE 206* 184* 231*  BUN 20 17 19   CREATININE 1.00 1.24 1.31*  CALCIUM  --  9.3 9.1  PROT  --  6.2*  --   ALBUMIN  --  3.4*  --   AST  --  81*  --   ALT  --  26  --   ALKPHOS  --  58  --   BILITOT  --  0.8  --   GFRNONAA  --  >60 59*  ANIONGAP  --  12 10     Hematology Recent Labs  Lab 06/19/20 0817 06/19/20 1630 06/20/20 0054  WBC  --  9.7 11.9*  RBC  --  4.41 4.32  HGB 13.3 14.0 13.2  HCT 39.0 38.5* 37.6*  MCV  --  87.3 87.0  MCH  --  31.7 30.6  MCHC  --  36.4* 35.1  RDW  --  13.3 13.1  PLT  --  283 284    BNPNo results for input(s): BNP, PROBNP in the last 168 hours.   DDimer No  results for input(s): DDIMER in the last 168 hours.   Radiology    CARDIAC CATHETERIZATION  Result Date: 06/19/2020 Conclusions: 1. Severe three-vessel coronary artery disease including 80% proximal LAD, sequential 40% ostial/proximal and 90% mid LCx, and 100% mid RCA lesions.  Culprit lesion for the patient's inferior STEMI (type 1 MI) is the mid RCA occlusion. 2. Mildly elevated left ventricular filling pressure (LVEDP 20-25 mmHg). 3. Successful primary PCI to mid RCA occlusion using Synergy 2.5 x 20 mm drug-eluting stent with 0% residual stenosis and TIMI-3 flow. Recommendations: 1. Continue cangrelor infusion for 2 hours after administration of ticagrelor at the end of PCI. 2. Dual antiplatelet therapy with aspirin and ticagrelor for at least 12 months. 3. Anticipate staged PCI to proximal LAD and mid LCx prior to discharge. 4. Obtain echocardiogram; left ventriculogram not performed during this procedure given history of CKD. 5. Aggressive secondary prevention. Yvonne Kendall, MD [facility]   ECHOCARDIOGRAM COMPLETE  Result Date: 06/19/2020    ECHOCARDIOGRAM REPORT   Patient Name:   Wayne Marshall Date of Exam: 06/19/2020 Medical Rec #:  [id number]         Height:        69.0 in Accession #:    [id number]        Weight:       163.0 lb Date of Birth:  March 20, 1951         BSA:          1.894 m Patient Age:    70 years          BP:           142/72 mmHg Patient Gender: M                 HR:           54 bpm. Exam Location:  Inpatient Procedure: 2D Echo, Cardiac Doppler and Color Doppler Indications:    Acute myocardial infarction, unspecified I21.9  History:        Patient has no prior history of Echocardiogram examinations.                 Stroke; Risk Factors:Hypertension, Diabetes, Dyslipidemia and                 Non-Smoker.  Sonographer:    Renella Cunas RDCS Referring Phys: [phone] CHRISTOPHER END IMPRESSIONS  1. Left ventricular ejection fraction, by estimation, is 55 to 60%. The left ventricle has normal function. The left ventricle demonstrates regional wall motion abnormalities with basal to mid inferoseptal hypokinesis and basal inferior hypokinesis. Left ventricular diastolic parameters are consistent with Grade II diastolic dysfunction (pseudonormalization).  2. Right ventricular systolic function is normal. The right ventricular size is normal. Tricuspid regurgitation signal is inadequate for assessing PA pressure.  3. The mitral valve is normal in structure. Trivial mitral valve regurgitation. No evidence of mitral stenosis.  4. The aortic valve is tricuspid. Aortic valve regurgitation is trivial. No aortic stenosis is present.  5. Aortic dilatation noted. There is mild dilatation of the aortic root, measuring 37 mm.  6. The inferior vena cava is normal in size with greater than 50% respiratory variability, suggesting right atrial pressure of 3 mmHg. FINDINGS  Left Ventricle: Left ventricular ejection fraction, by estimation, is 55 to 60%. The left ventricle has normal function. The left ventricle demonstrates regional wall motion abnormalities. The left ventricular internal cavity size was normal in size. There is no left ventricular hypertrophy. Left ventricular diastolic  parameters are consistent with Grade II diastolic dysfunction (pseudonormalization). Right Ventricle: The right ventricular size is normal. No increase in right ventricular wall thickness. Right ventricular systolic function is normal. Tricuspid regurgitation signal is inadequate for assessing PA pressure. Left Atrium: Left atrial size was normal in size. Right Atrium: Right atrial size was normal in size. Pericardium: There is no evidence of pericardial effusion. Mitral Valve: The mitral valve is normal in structure. Trivial mitral valve regurgitation. No evidence of mitral valve stenosis. Tricuspid Valve: The tricuspid valve is normal in structure. Tricuspid valve regurgitation is not demonstrated. Aortic Valve: The aortic valve is tricuspid. Aortic valve regurgitation is trivial. No aortic stenosis is present. Pulmonic Valve: The pulmonic valve was normal in structure. Pulmonic valve regurgitation is not visualized. Aorta: The aortic root is normal in size and structure and aortic dilatation noted. There is mild dilatation of the aortic root, measuring 37 mm. Venous: The inferior vena cava is normal in size with greater than 50% respiratory variability, suggesting right atrial pressure of 3 mmHg. IAS/Shunts: No atrial level shunt detected by color flow Doppler.  LEFT VENTRICLE PLAX 2D LVIDd:         4.40 cm      Diastology LVIDs:         3.40 cm      LV e' medial:    6.05 cm/s LV PW:         0.90 cm      LV E/e' medial:  12.1 LV IVS:        0.90 cm      LV e' lateral:   7.36 cm/s LVOT diam:     2.20 cm      LV E/e' lateral: 10.0 LV SV:         76 LV SV Index:   40 LVOT Area:     3.80 cm  LV Volumes (MOD) LV vol d, MOD A2C: 124.0 ml LV vol d, MOD A4C: 126.0 ml LV vol s, MOD A2C: 59.5 ml LV vol s, MOD A4C: 57.0 ml LV SV MOD A2C:     64.5 ml LV SV MOD A4C:     126.0 ml LV SV MOD BP:      68.7 ml RIGHT VENTRICLE RV S prime:     10.40 cm/s TAPSE (M-mode): 2.4 cm LEFT ATRIUM             Index       RIGHT ATRIUM            Index LA diam:        3.00 cm 1.58 cm/m  RA Area:     14.20 cm LA Vol (A2C):   40.2 ml 21.23 ml/m RA Volume:   36.70 ml  19.38 ml/m LA Vol (A4C):   33.2 ml 17.53 ml/m LA Biplane Vol: 38.5 ml 20.33 ml/m  AORTIC VALVE LVOT Vmax:   89.80 cm/s LVOT Vmean:  56.200 cm/s LVOT VTI:    0.199 m  AORTA Ao Root diam: 3.60 cm MITRAL VALVE MV Area (PHT): 4.39 cm    SHUNTS MV Decel Time: 173 msec    Systemic VTI:  0.20 m MV E velocity: 73.40 cm/s  Systemic Diam: 2.20 cm MV A velocity: 68.20 cm/s MV E/A ratio:  1.08 Marca Ancona MD Electronically signed by Marca Ancona MD Signature Date/Time: 06/19/2020/4:26:15 PM    Final     Cardiac Studies   cath films reviewed  Patient Profile     70 y.o. male  with inferior STEMI.  Residual focal disease in the prox LAD.  Diffusely diseased circumflex with a focal mid circumflex lesion.   Assessment & Plan    1) CAD/MI: Continue DAPT with aggressive secondary prevention.  High dose statin. Will plan for PCI tomorrow if BMet is ok. Administer coreg based on BP and HR.  Plan to transfer to tele.    Diet given today.  Carb modified.  DM controlled.  A1C 6.2  For questions or updates, please contact [facility] Please consult [email or web address] for contact info under        Signed, Lance Muss, MD  06/20/2020, 10:21 AM

## 2020-06-21 ENCOUNTER — Encounter (HOSPITAL_COMMUNITY): Payer: Self-pay | Admitting: Cardiovascular Disease

## 2020-06-21 ENCOUNTER — Inpatient Hospital Stay (HOSPITAL_COMMUNITY)
Admission: EM | Disposition: A | Discharge status: Home / Self Care | Payer: Self-pay | Source: Home / Self Care | Attending: Internal Medicine

## 2020-06-21 DIAGNOSIS — E1159 Type 2 diabetes mellitus with other circulatory complications: Secondary | ICD-10-CM | POA: Diagnosis not present

## 2020-06-21 DIAGNOSIS — I2111 ST elevation (STEMI) myocardial infarction involving right coronary artery: Secondary | ICD-10-CM | POA: Diagnosis not present

## 2020-06-21 DIAGNOSIS — E782 Mixed hyperlipidemia: Secondary | ICD-10-CM | POA: Diagnosis not present

## 2020-06-21 DIAGNOSIS — E1122 Type 2 diabetes mellitus with diabetic chronic kidney disease: Secondary | ICD-10-CM | POA: Diagnosis not present

## 2020-06-21 DIAGNOSIS — I2510 Atherosclerotic heart disease of native coronary artery without angina pectoris: Secondary | ICD-10-CM | POA: Diagnosis not present

## 2020-06-21 DIAGNOSIS — I10 Essential (primary) hypertension: Secondary | ICD-10-CM | POA: Diagnosis not present

## 2020-06-21 DIAGNOSIS — N183 Chronic kidney disease, stage 3 unspecified: Secondary | ICD-10-CM | POA: Diagnosis not present

## 2020-06-21 HISTORY — PX: INTRAVASCULAR ULTRASOUND/IVUS: CATH118244

## 2020-06-21 HISTORY — PX: CORONARY STENT INTERVENTION: CATH118234

## 2020-06-21 LAB — BASIC METABOLIC PANEL
Anion gap: 10 (ref 5–15)
BUN: 17 mg/dL (ref 8–23)
CO2: 22 mmol/L (ref 22–32)
Calcium: 9.3 mg/dL (ref 8.9–10.3)
Chloride: 108 mmol/L (ref 98–111)
Creatinine, Ser: 1.18 mg/dL (ref 0.61–1.24)
GFR, Estimated: >60 mL/min (ref >60)
Glucose, Bld: 131 mg/dL — ABNORMAL HIGH (ref 70–99)
Potassium: 3.6 mmol/L (ref 3.5–5.1)
Sodium: 140 mmol/L (ref 135–145)

## 2020-06-21 LAB — GLUCOSE, CAPILLARY
Glucose-Capillary: 122 mg/dL — ABNORMAL HIGH (ref 70–99)
Glucose-Capillary: 87 mg/dL (ref 70–99)
Glucose-Capillary: 159 mg/dL — ABNORMAL HIGH (ref 70–99)

## 2020-06-21 LAB — POCT ACTIVATED CLOTTING TIME: Activated Clotting Time: 833 seconds

## 2020-06-21 SURGERY — CORONARY STENT INTERVENTION
Anesthesia: LOCAL

## 2020-06-21 MED ORDER — SODIUM CHLORIDE 0.9 % IV SOLN: INTRAVENOUS | Status: DC

## 2020-06-21 MED ORDER — FENTANYL CITRATE (PF) 100 MCG/2ML IJ SOLN
INTRAMUSCULAR | Status: DC | PRN
  Administered 2020-06-21: 25 ug via INTRAVENOUS

## 2020-06-21 MED ORDER — NITROGLYCERIN 0.4 MG SL SUBL: 0.4000 mg | SUBLINGUAL_TABLET | SUBLINGUAL | 2 refills | Status: DC | PRN

## 2020-06-21 MED ORDER — MIDAZOLAM HCL 2 MG/2ML IJ SOLN
INTRAMUSCULAR | Status: AC
  Filled 2020-06-21: qty 2

## 2020-06-21 MED ORDER — MIDAZOLAM HCL 2 MG/2ML IJ SOLN
INTRAMUSCULAR | Status: DC | PRN
  Administered 2020-06-21: 1 mg via INTRAVENOUS

## 2020-06-21 MED ORDER — HEPARIN SODIUM (PORCINE) 1000 UNIT/ML IJ SOLN
INTRAMUSCULAR | Status: AC
  Filled 2020-06-21: qty 1

## 2020-06-21 MED ORDER — IOHEXOL 350 MG/ML SOLN
INTRAVENOUS | Status: DC | PRN
  Administered 2020-06-21: 90 mL

## 2020-06-21 MED ORDER — SODIUM CHLORIDE 0.9% FLUSH: 3.0000 mL | Freq: Two times a day (BID) | INTRAVENOUS | Status: DC

## 2020-06-21 MED ORDER — VERAPAMIL HCL 2.5 MG/ML IV SOLN
INTRAVENOUS | Status: AC
  Filled 2020-06-21: qty 2

## 2020-06-21 MED ORDER — LIDOCAINE HCL (PF) 1 % IJ SOLN
INTRAMUSCULAR | Status: AC
  Filled 2020-06-21: qty 30

## 2020-06-21 MED ORDER — SODIUM CHLORIDE 0.9% FLUSH: 3.0000 mL | INTRAVENOUS | Status: DC | PRN

## 2020-06-21 MED ORDER — NITROGLYCERIN 1 MG/10 ML FOR IR/CATH LAB
INTRA_ARTERIAL | Status: AC
  Filled 2020-06-21: qty 10

## 2020-06-21 MED ORDER — FENTANYL CITRATE (PF) 100 MCG/2ML IJ SOLN
INTRAMUSCULAR | Status: AC
  Filled 2020-06-21: qty 2

## 2020-06-21 MED ORDER — ATORVASTATIN CALCIUM 80 MG PO TABS
80.0000 mg | ORAL_TABLET | Freq: Every day | ORAL | 1 refills | Status: AC
Start: 2020-06-22 — End: ?

## 2020-06-21 MED ORDER — LIDOCAINE HCL (PF) 1 % IJ SOLN
INTRAMUSCULAR | Status: DC | PRN
  Administered 2020-06-21: 2 mL

## 2020-06-21 MED ORDER — ASPIRIN 81 MG PO CHEW: 81.0000 mg | CHEWABLE_TABLET | Freq: Every day | ORAL | 1 refills | Status: DC

## 2020-06-21 MED ORDER — HEPARIN (PORCINE) IN NACL 1000-0.9 UT/500ML-% IV SOLN
INTRAVENOUS | Status: AC
  Filled 2020-06-21: qty 1500

## 2020-06-21 MED ORDER — VERAPAMIL HCL 2.5 MG/ML IV SOLN
INTRAVENOUS | Status: DC | PRN
  Administered 2020-06-21: 10 mL via INTRA_ARTERIAL

## 2020-06-21 MED ORDER — HEPARIN SODIUM (PORCINE) 1000 UNIT/ML IJ SOLN
INTRAMUSCULAR | Status: DC | PRN
  Administered 2020-06-21: 8000 [IU] via INTRAVENOUS

## 2020-06-21 MED ORDER — HEPARIN (PORCINE) IN NACL 1000-0.9 UT/500ML-% IV SOLN
INTRAVENOUS | Status: DC | PRN
  Administered 2020-06-21 (×2): 500 mL

## 2020-06-21 MED ORDER — TICAGRELOR 90 MG PO TABS: 90.0000 mg | ORAL_TABLET | Freq: Two times a day (BID) | ORAL | 2 refills | Status: DC

## 2020-06-21 MED ORDER — SODIUM CHLORIDE 0.9 % IV SOLN: 250.0000 mL | INTRAVENOUS | Status: DC | PRN

## 2020-06-21 MED FILL — ASPIRIN LOW DOSE 81 MG CHEW: 81 | 90 days supply | Qty: 90 | Fill #0

## 2020-06-21 MED FILL — NITROGLYCERIN 0.4 MG TAB SL: 0.4 | 8 days supply | Qty: 25 | Fill #0

## 2020-06-21 MED FILL — ATORVASTATIN CALCIUM 80 MG: 80 | 90 days supply | Qty: 90 | Fill #0

## 2020-06-21 MED FILL — BRILINTA 90 MG TABLET: 90 | 30 days supply | Qty: 60 | Fill #0

## 2020-06-21 SURGICAL SUPPLY — 17 items
BALLN SAPPHIRE 3.0X15 (BALLOONS) ×2
BALLN SAPPHIRE ~~LOC~~ 4.0X18 (BALLOONS) ×2 IMPLANT
BALLOON SAPPHIRE 3.0X15 (BALLOONS) ×1 IMPLANT
CATH DRAGONFLY OPSTAR (CATHETERS) ×2 IMPLANT
CATH LAUNCHER 6FR EBU3.5 (CATHETERS) ×2 IMPLANT
DEVICE RAD COMP TR BAND LRG (VASCULAR PRODUCTS) ×2 IMPLANT
GLIDESHEATH SLEND SS 6F .021 (SHEATH) ×2 IMPLANT
GUIDEWIRE INQWIRE 1.5J.035X260 (WIRE) ×1 IMPLANT
INQWIRE 1.5J .035X260CM (WIRE) ×2
KIT ENCORE 26 ADVANTAGE (KITS) ×2 IMPLANT
KIT HEART LEFT (KITS) ×2 IMPLANT
PACK CARDIAC CATHETERIZATION (CUSTOM PROCEDURE TRAY) ×2 IMPLANT
STENT SYNERGY XD 3.50X24 (Permanent Stent) ×1 IMPLANT
SYNERGY XD 3.50X24 (Permanent Stent) ×2 IMPLANT
TRANSDUCER W/STOPCOCK (MISCELLANEOUS) ×2 IMPLANT
TUBING CIL FLEX 10 FLL-RA (TUBING) ×2 IMPLANT
WIRE RUNTHROUGH .014X180CM (WIRE) ×2 IMPLANT

## 2020-06-21 NOTE — Progress Notes (Signed)
CARDIAC REHAB PHASE I   Began MI education with pt and wife. Pt educated on importance of ASA and Brilinta. Given MI book. Reviewed site care and restrictions. Pt for a staged intervention shortly. Possibility of d/c later today, will f/u to ambulate and complete education as appropriate.  [phone] Reynold Bowen, RN BSN 06/21/2020 9:42 AM

## 2020-06-21 NOTE — Discharge Summary (Addendum)
Discharge Summary    Patient ID: Wayne MorganJerry Neal Schlicker MRN: [id number]; DOB: January 03, 1951  Admit date: 06/19/2020 Discharge date: 06/21/2020  Primary Care Provider: Abner GreenspanHodges, Beth, MD  Primary Cardiologist: Enzo MontgomeryNew ([place]) Primary Electrophysiologist:  None   Discharge Diagnoses    Principal Problem:   STEMI (ST elevation myocardial infarction) [facility](HCC) Active Problems:   DM2 (diabetes mellitus, type 2) (HCC)   HTN (hypertension)   CKD stage 3 secondary to diabetes [facility](HCC)   STEMI involving right coronary artery [facility](HCC)   Diagnostic Studies/Procedures    Cath: 06/19/20  Conclusions: 1. Severe three-vessel coronary artery disease including 80% proximal LAD, sequential 40% ostial/proximal and 90% mid LCx, and 100% mid RCA lesions.  Culprit lesion for the patient's inferior STEMI (type 1 MI) is the mid RCA occlusion. 2. Mildly elevated left ventricular filling pressure (LVEDP 20-25 mmHg). 3. Successful primary PCI to mid RCA occlusion using Synergy 2.5 x 20 mm drug-eluting stent with 0% residual stenosis and TIMI-3 flow.  Recommendations: 1. Continue cangrelor infusion for 2 hours after administration of ticagrelor at the end of PCI. 2. Dual antiplatelet therapy with aspirin and ticagrelor for at least 12 months. 3. Anticipate staged PCI to proximal LAD and mid LCx prior to discharge. 4. Obtain echocardiogram; left ventriculogram not performed during this procedure given history of CKD. 5. Aggressive secondary prevention.  Yvonne Kendallhristopher End, MD [facility]  Diagnostic Dominance: Right    Intervention    Cath: 06/21/20   1st Diag lesion is 50% stenosed.  Prox LAD lesion is 80% stenosed.  1st Mrg lesion is 90% stenosed.  Mid Cx lesion is 90% stenosed.  Ost Cx to Prox Cx lesion is 70% stenosed.  Mid LAD lesion is 50% stenosed.  A drug-eluting stent was successfully placed using a SYNERGY XD 3.50X24.  Post intervention, there is a 0% residual stenosis.  Post intervention,  there is a 0% residual stenosis.   Successful OCT-guided angioplasty and drug-eluting stent placement to the proximal LAD.  Recommendations: Initial plans were to possibly intervene on the left circumflex but angiography showed diffuse disease extending all the way back to the ostium with relatively small vessel diameter.  I felt that medical therapy is probably best and reserving PCI for refractory anginal symptoms.  The patient is already on dual antiplatelet therapy. He can be discharged home with no other issues.  Diagnostic Dominance: Right    Intervention     Echo: 06/19/20  IMPRESSIONS    1. Left ventricular ejection fraction, by estimation, is 55 to 60%. The  left ventricle has normal function. The left ventricle demonstrates  regional wall motion abnormalities with basal to mid inferoseptal  hypokinesis and basal inferior hypokinesis.  Left ventricular diastolic parameters are consistent with Grade II  diastolic dysfunction (pseudonormalization).  2. Right ventricular systolic function is normal. The right ventricular  size is normal. Tricuspid regurgitation signal is inadequate for assessing  PA pressure.  3. The mitral valve is normal in structure. Trivial mitral valve  regurgitation. No evidence of mitral stenosis.  4. The aortic valve is tricuspid. Aortic valve regurgitation is trivial.  No aortic stenosis is present.  5. Aortic dilatation noted. There is mild dilatation of the aortic root,  measuring 37 mm.  6. The inferior vena cava is normal in size with greater than 50%  respiratory variability, suggesting right atrial pressure of 3 mmHg.  _____________   History of Present Illness     Wayne MorganJerry Neal Marshall is a 70 y.o. male with history of hypertension,  hyperlipidemia, type 2 diabetes mellitus, stroke, chronic kidney disease stage III, and COVID-19 infection (06/2019), presenting via EMS with acute onset of chest pain rating to the right arm and  inferior ST elevation consistent with STEMI.  Mr. Gucciardo was in his usual state of health.  He arrived at work around 430 the morning of admission and was vacuuming when he had sudden onset of chest pain rating to the right arm. This was associated with lightheadedness and nausea.  EMS was summoned and found him to be hypotensive with a blood pressure of 80/40.  EKG showed sinus bradycardia with inferior ST segment elevation.  He received aspirin 324 mg x 1 and had been transferred to [facility] for [facility] evaluation.  At the time, he continues to complain of 3/10 chest pain, now radiating to the back.  He denies a history of prior heart disease.  Patient was referred for emergent cardiac catheterization.  He was found to have severe three-vessel coronary artery disease including 80% proximal LAD, 90% mid LCx, and 100% mid RCA lesions.  He underwent primary PCI to the mid RCA with resolution of chest pain.   Hospital Course     1.  STEMI: Noted to have severe three-vessel coronary disease including 80% proximal LAD, 90% mid circumflex and 100% mid RCA lesions.  Culprit lesion felt to be mid RCA occlusion which was treated with PCI/DES x1 with 0% residual stenosis and TIMI-3 flow.  He was placed on Cangrelor for 2 hours post PCI.  Started on DAPT with aspirin/Brilinta with recommendations for 1 year.  High-sensitivity troponin peaked at 26,335. Underwent staged PCI to proximal LAD with Dr. Kirke Corin 2 days later. Initially plans were to possibly intervene on left circumflex but angiography showed diffuse disease extending back into the ostium with relatively small vessel diameter. It was recommended to continue medical therapy for the circumflex and reserve PCI if patient develops refractory anginal symptoms.  He remained stable post cath and was felt to be appropriate for same-day discharge.  Echo showed normal EF of 55 to 60% with regional wall motion abnormalities including hypokinesis in the basal to mid  inferior septal walls.  --Continue on aspirin, Brilinta, Atorvastatin, Coreg   2.  Hyperlipidemia: LDL 73 --Switched from lovastatin to atorvastatin 80 mg daily  3.  Hypertension: Stable at time of discharge --Continue Coreg 6.25 mg twice daily, plan to resume Diovan 320/12.5 mg daily at discharge  4.  Diabetes: Hemoglobin A1c 6.2 --Treated with sliding scale insulin while inpatient, plan to resume Glipizide/Metformin at discharge  Did the patient have an acute coronary syndrome (MI, NSTEMI, STEMI, etc) this admission?:  Yes                               AHA/ACC Clinical Performance & Quality Measures: 6. Aspirin prescribed? - Yes 7. ADP Receptor Inhibitor (Plavix/Clopidogrel, Brilinta/Ticagrelor or Effient/Prasugrel) prescribed (includes medically managed patients)? - Yes 8. Beta Blocker prescribed? - Yes 9. High Intensity Statin (Lipitor 40-80mg  or Crestor 20-40mg ) prescribed? - Yes 10. EF assessed during THIS hospitalization? - Yes 11. For EF <40%, was ACEI/ARB prescribed? - Not Applicable (EF >/= 40%) 12. For EF <40%, Aldosterone Antagonist (Spironolactone or Eplerenone) prescribed? - Not Applicable (EF >/= 40%) 13. Cardiac Rehab Phase II ordered (including medically managed patients)? - Yes     _____________  Discharge Vitals Blood pressure (!) 148/82, pulse 66, temperature 97.9 F (36.6 C), temperature source Oral, resp.  rate 17, height 5\' 9"  (1.753 m), weight 78.1 kg, SpO2 98 %.  Filed Weights   06/19/20 1600 06/21/20 0413  Weight: 78.5 kg 78.1 kg    Labs & Radiologic Studies    CBC Recent Labs    06/19/20 1630 06/20/20 0054  WBC 9.7 11.9*  NEUTROABS 8.3*  --   HGB 14.0 13.2  HCT 38.5* 37.6*  MCV 87.3 87.0  PLT 283 284   Basic Metabolic Panel Recent Labs    08/18/20 0054 06/21/20 0142  NA 137 140  K 3.7 3.6  CL 106 108  CO2 21* 22  GLUCOSE 231* 131*  BUN 19 17  CREATININE 1.31* 1.18  CALCIUM 9.1 9.3   Liver Function Tests Recent Labs     06/19/20 1630  AST 81*  ALT 26  ALKPHOS 58  BILITOT 0.8  PROT 6.2*  ALBUMIN 3.4*   No results for input(s): LIPASE, AMYLASE in the last 72 hours. High Sensitivity Troponin:   Recent Labs  Lab 06/19/20 1630 06/20/20 0054  TROPONINIHS 08/18/20* 63,875*    BNP Invalid input(s): POCBNP D-Dimer No results for input(s): DDIMER in the last 72 hours. Hemoglobin A1C Recent Labs    06/19/20 0935  HGBA1C 6.2*   Fasting Lipid Panel Recent Labs    06/19/20 1630  CHOL 134  HDL 39*  LDLCALC 73  TRIG 08/17/20  CHOLHDL 3.4   Thyroid Function Tests Recent Labs    06/19/20 1630  TSH 1.989   _____________  CARDIAC CATHETERIZATION  Addendum Date: 06/21/2020    1st Diag lesion is 50% stenosed.  Prox LAD lesion is 80% stenosed.  1st Mrg lesion is 90% stenosed.  Mid Cx lesion is 90% stenosed.  Ost Cx to Prox Cx lesion is 70% stenosed.  Mid LAD lesion is 50% stenosed.  A drug-eluting stent was successfully placed using a SYNERGY XD 3.50X24.  Post intervention, there is a 0% residual stenosis.  Post intervention, there is a 0% residual stenosis.  Successful OCT-guided angioplasty and drug-eluting stent placement to the proximal LAD. Recommendations: Initial plans were to possibly intervene on the left circumflex but angiography showed diffuse disease extending all the way back to the ostium with relatively small vessel diameter.  I felt that medical therapy is probably best and reserving PCI for refractory anginal symptoms. The patient is already on dual antiplatelet therapy. He can be discharged home with no other issues.   Result Date: 06/21/2020  1st Diag lesion is 50% stenosed.  Prox LAD lesion is 80% stenosed.  1st Mrg lesion is 90% stenosed.  Mid Cx lesion is 90% stenosed.  Prox RCA lesion is 30% stenosed.  Acute Mrg lesion is 90% stenosed.  Previously placed Mid RCA drug eluting stent is widely patent.  Balloon angioplasty was performed.  Ost Cx to Prox Cx lesion is 70%  stenosed.  Mid LAD lesion is 50% stenosed.  A drug-eluting stent was successfully placed using a SYNERGY XD 3.50X24.  Post intervention, there is a 0% residual stenosis.  Post intervention, there is a 0% residual stenosis.  Successful OCT-guided angioplasty and drug-eluting stent placement to the proximal LAD. Recommendations: Initial plans were to possibly intervene on the left circumflex but angiography showed diffuse disease extending all the way back to the ostium with relatively small vessel diameter.  I felt that medical therapy is probably best and reserving PCI for refractory anginal symptoms. The patient is already on dual antiplatelet therapy. He can be discharged home with no other issues.  CARDIAC CATHETERIZATION  Result Date: 06/19/2020 Conclusions: 1. Severe three-vessel coronary artery disease including 80% proximal LAD, sequential 40% ostial/proximal and 90% mid LCx, and 100% mid RCA lesions.  Culprit lesion for the patient's inferior STEMI (type 1 MI) is the mid RCA occlusion. 2. Mildly elevated left ventricular filling pressure (LVEDP 20-25 mmHg). 3. Successful primary PCI to mid RCA occlusion using Synergy 2.5 x 20 mm drug-eluting stent with 0% residual stenosis and TIMI-3 flow. Recommendations: 1. Continue cangrelor infusion for 2 hours after administration of ticagrelor at the end of PCI. 2. Dual antiplatelet therapy with aspirin and ticagrelor for at least 12 months. 3. Anticipate staged PCI to proximal LAD and mid LCx prior to discharge. 4. Obtain echocardiogram; left ventriculogram not performed during this procedure given history of CKD. 5. Aggressive secondary prevention. Yvonne Kendall, MD [facility]   ECHOCARDIOGRAM COMPLETE  Result Date: 06/19/2020    ECHOCARDIOGRAM REPORT   Patient Name:   KARAM DUNSON Stingley Date of Exam: 06/19/2020 Medical Rec #:  [id number]         Height:       69.0 in Accession #:    [id number]        Weight:       163.0 lb Date of Birth:  [birth year]          BSA:          1.894 m Patient Age:    69 years          BP:           142/72 mmHg Patient Gender: M                 HR:           54 bpm. Exam Location:  Inpatient Procedure: 2D Echo, Cardiac Doppler and Color Doppler Indications:    Acute myocardial infarction, unspecified I21.9  History:        Patient has no prior history of Echocardiogram examinations.                 Stroke; Risk Factors:Hypertension, Diabetes, Dyslipidemia and                 Non-Smoker.  Sonographer:    Renella Cunas RDCS Referring Phys: [phone] CHRISTOPHER END IMPRESSIONS  1. Left ventricular ejection fraction, by estimation, is 55 to 60%. The left ventricle has normal function. The left ventricle demonstrates regional wall motion abnormalities with basal to mid inferoseptal hypokinesis and basal inferior hypokinesis. Left ventricular diastolic parameters are consistent with Grade II diastolic dysfunction (pseudonormalization).  2. Right ventricular systolic function is normal. The right ventricular size is normal. Tricuspid regurgitation signal is inadequate for assessing PA pressure.  3. The mitral valve is normal in structure. Trivial mitral valve regurgitation. No evidence of mitral stenosis.  4. The aortic valve is tricuspid. Aortic valve regurgitation is trivial. No aortic stenosis is present.  5. Aortic dilatation noted. There is mild dilatation of the aortic root, measuring 37 mm.  6. The inferior vena cava is normal in size with greater than 50% respiratory variability, suggesting right atrial pressure of 3 mmHg. FINDINGS  Left Ventricle: Left ventricular ejection fraction, by estimation, is 55 to 60%. The left ventricle has normal function. The left ventricle demonstrates regional wall motion abnormalities. The left ventricular internal cavity size was normal in size. There is no left ventricular hypertrophy. Left ventricular diastolic parameters are consistent with Grade II diastolic dysfunction (pseudonormalization). Right  Ventricle: The right ventricular size  is normal. No increase in right ventricular wall thickness. Right ventricular systolic function is normal. Tricuspid regurgitation signal is inadequate for assessing PA pressure. Left Atrium: Left atrial size was normal in size. Right Atrium: Right atrial size was normal in size. Pericardium: There is no evidence of pericardial effusion. Mitral Valve: The mitral valve is normal in structure. Trivial mitral valve regurgitation. No evidence of mitral valve stenosis. Tricuspid Valve: The tricuspid valve is normal in structure. Tricuspid valve regurgitation is not demonstrated. Aortic Valve: The aortic valve is tricuspid. Aortic valve regurgitation is trivial. No aortic stenosis is present. Pulmonic Valve: The pulmonic valve was normal in structure. Pulmonic valve regurgitation is not visualized. Aorta: The aortic root is normal in size and structure and aortic dilatation noted. There is mild dilatation of the aortic root, measuring 37 mm. Venous: The inferior vena cava is normal in size with greater than 50% respiratory variability, suggesting right atrial pressure of 3 mmHg. IAS/Shunts: No atrial level shunt detected by color flow Doppler.  LEFT VENTRICLE PLAX 2D LVIDd:         4.40 cm      Diastology LVIDs:         3.40 cm      LV e' medial:    6.05 cm/s LV PW:         0.90 cm      LV E/e' medial:  12.1 LV IVS:        0.90 cm      LV e' lateral:   7.36 cm/s LVOT diam:     2.20 cm      LV E/e' lateral: 10.0 LV SV:         76 LV SV Index:   40 LVOT Area:     3.80 cm  LV Volumes (MOD) LV vol d, MOD A2C: 124.0 ml LV vol d, MOD A4C: 126.0 ml LV vol s, MOD A2C: 59.5 ml LV vol s, MOD A4C: 57.0 ml LV SV MOD A2C:     64.5 ml LV SV MOD A4C:     126.0 ml LV SV MOD BP:      68.7 ml RIGHT VENTRICLE RV S prime:     10.40 cm/s TAPSE (M-mode): 2.4 cm LEFT ATRIUM             Index       RIGHT ATRIUM           Index LA diam:        3.00 cm 1.58 cm/m  RA Area:     14.20 cm LA Vol (A2C):   40.2  ml 21.23 ml/m RA Volume:   36.70 ml  19.38 ml/m LA Vol (A4C):   33.2 ml 17.53 ml/m LA Biplane Vol: 38.5 ml 20.33 ml/m  AORTIC VALVE LVOT Vmax:   89.80 cm/s LVOT Vmean:  56.200 cm/s LVOT VTI:    0.199 m  AORTA Ao Root diam: 3.60 cm MITRAL VALVE MV Area (PHT): 4.39 cm    SHUNTS MV Decel Time: 173 msec    Systemic VTI:  0.20 m MV E velocity: 73.40 cm/s  Systemic Diam: 2.20 cm MV A velocity: 68.20 cm/s MV E/A ratio:  1.08 Marca Ancona MD Electronically signed by Marca Ancona MD Signature Date/Time: 06/19/2020/4:26:15 PM    Final    Disposition   Pt is being discharged home today in good condition.  Follow-up Plans & Appointments     Discharge Instructions    Amb Referral to Cardiac Rehabilitation  Complete by: As directed    Will refer to Cardiac Rehab Phase 2 [place]   Diagnosis:  Coronary Stents STEMI     After initial evaluation and assessments completed: Virtual Based Care may be provided alone or in conjunction with Phase 2 Cardiac Rehab based on patient barriers.: Yes   Call MD for:  difficulty breathing, headache or visual disturbances   Complete by: As directed    Call MD for:  persistant dizziness or light-headedness   Complete by: As directed    Call MD for:  redness, tenderness, or signs of infection (pain, swelling, redness, odor or green/yellow discharge around incision site)   Complete by: As directed    Diet - low sodium heart healthy   Complete by: As directed    Discharge instructions   Complete by: As directed    Radial Site Care Refer to this sheet in the next few weeks. These instructions provide you with information on caring for yourself after your procedure. Your caregiver may also give you more specific instructions. Your treatment has been planned according to current medical practices, but problems sometimes occur. Call your caregiver if you have any problems or questions after your procedure. HOME CARE INSTRUCTIONS You may shower the day after the  procedure.Remove the bandage (dressing) and gently wash the site with plain soap and water.Gently pat the site dry.  Do not apply powder or lotion to the site.  Do not submerge the affected site in water for 3 to 5 days.  Inspect the site at least twice daily.  Do not flex or bend the affected arm for 24 hours.  No lifting over 5 pounds (2.3 kg) for 5 days after your procedure.  Do not drive home if you are discharged the same day of the procedure. Have someone else drive you.  You may drive 24 hours after the procedure unless otherwise instructed by your caregiver.  What to expect: Any bruising will usually fade within 1 to 2 weeks.  Blood that collects in the tissue (hematoma) may be painful to the touch. It should usually decrease in size and tenderness within 1 to 2 weeks.  SEEK IMMEDIATE MEDICAL CARE IF: You have unusual pain at the radial site.  You have redness, warmth, swelling, or pain at the radial site.  You have drainage (other than a small amount of blood on the dressing).  You have chills.  You have a fever or persistent symptoms for more than 72 hours.  You have a fever and your symptoms suddenly get worse.  Your arm becomes pale, cool, tingly, or numb.  You have heavy bleeding from the site. Hold pressure on the site.   PLEASE DO NOT MISS ANY DOSES OF YOUR BRILINTA!!!!! Also keep a log of you blood pressures and bring back to your follow up appt. Please call the office with any questions.   Patients taking blood thinners should generally stay away from medicines like ibuprofen, Advil, Motrin, naproxen, and Aleve due to risk of stomach bleeding. You may take Tylenol as directed or talk to your primary doctor about alternatives.   PLEASE ENSURE THAT YOU DO NOT RUN OUT OF YOUR BRILINTA. This medication is very important to remain on for at least one year. IF you have issues obtaining this medication due to cost please CALL the office 3-5 business days prior to running out in  order to prevent missing doses of this medication.   Increase activity slowly   Complete by: As directed  Discharge Medications   Allergies as of 06/21/2020   No Known Allergies     Medication List    STOP taking these medications   lovastatin 20 MG tablet Commonly known as: MEVACOR     TAKE these medications   acetaminophen 325 MG tablet Commonly known as: TYLENOL Take 2 tablets (650 mg total) by mouth every 6 (six) hours as needed for mild pain or headache (fever >/= 101).   allopurinol 100 MG tablet Commonly known as: ZYLOPRIM Take 100 mg by mouth daily.   aspirin 81 MG chewable tablet Chew 1 tablet (81 mg total) by mouth daily. Start taking on: June 22, 2020   atorvastatin 80 MG tablet Commonly known as: LIPITOR Take 1 tablet (80 mg total) by mouth daily. Start taking on: June 22, 2020   carvedilol 6.25 MG tablet Commonly known as: COREG Take 6.25 mg by mouth 2 (two) times daily with a meal.   glipiZIDE 5 MG tablet Commonly known as: GLUCOTROL Take 5 mg at 7 AM if sugar is > 200 and take 2.5 mg at 7 PM if sugar is > 200 What changed:   how much to take  how to take this  when to take this  additional instructions   guaiFENesin-dextromethorphan 100-10 MG/5ML syrup Commonly known as: ROBITUSSIN DM Take 10 mLs by mouth every 4 (four) hours as needed for cough.   Ipratropium-Albuterol 20-100 MCG/ACT Aers respimat Commonly known as: COMBIVENT Inhale 1 puff into the lungs every 6 (six) hours as needed for wheezing or shortness of breath.   METAMUCIL FIBER PO Take 1 tablet by mouth at bedtime.   metFORMIN 1000 MG tablet Commonly known as: GLUCOPHAGE Take 1,000 mg by mouth 2 (two) times daily with a meal. Notes to patient: DO NOT restarted this medications until 2/12   multivitamin with minerals Tabs tablet Take 1 tablet by mouth daily.   nitroGLYCERIN 0.4 MG SL tablet Commonly known as: Nitrostat Place 1 tablet (0.4 mg total) under  the tongue every 5 (five) minutes as needed.   OVER THE COUNTER MEDICATION Take 1 tablet by mouth daily. OTC prostate product called Urinozinc   ticagrelor 90 MG Tabs tablet Commonly known as: BRILINTA Take 1 tablet (90 mg total) by mouth 2 (two) times daily.   valsartan-hydrochlorothiazide 320-12.5 MG tablet Commonly known as: DIOVAN-HCT Take 1 tablet by mouth daily.       Outstanding Labs/Studies   FLP/LFTs in 8 weeks  Duration of Discharge Encounter   Greater than 30 minutes including physician time.  Signed, Laverda Page, NP 06/21/2020, 3:53 PM  I have examined the patient and reviewed assessment and plan and discussed with patient.  Agree with above as stated.    MI: No CHF.  PCI of LAD successful.  Medical management of circumflex.  Prox LAD lesion which supplies larger territory is a more important lesion.  THe circ is diffusely diseased but there is a focal area of severe stenosis in the mid vessel.  RF modification.  HTN management.  Lipid lowering therapy. DM control.  Whole food, plant based diet will be helpful.   Cardiac rehab recommended.  Ok for discharge.   Lance Muss

## 2020-06-21 NOTE — Progress Notes (Signed)
Progress Note  Patient Name: Wayne Marshall Date of Encounter: 06/21/2020  [facility] Cardiologist: No primary care provider on file. new ([place])  Subjective   No chest pain  Inpatient Medications    Scheduled Meds: . allopurinol  100 mg Oral Daily  . aspirin  81 mg Oral Daily  . atorvastatin  80 mg Oral Daily  . carvedilol  6.25 mg Oral BID WC  . enoxaparin (LOVENOX) injection  40 mg Subcutaneous Q24H  . insulin aspart  0-15 Units Subcutaneous TID WC  . insulin aspart  0-5 Units Subcutaneous QHS  . sodium chloride flush  3 mL Intravenous Q12H  . sodium chloride flush  3 mL Intravenous Q12H  . ticagrelor  90 mg Oral BID   Continuous Infusions: . sodium chloride 10 mL/hr at 06/19/20 0910  . sodium chloride    . sodium chloride 1 mL/kg/hr (06/21/20 0509)   PRN Meds: sodium chloride, sodium chloride, acetaminophen, ondansetron (ZOFRAN) IV, sodium chloride flush, sodium chloride flush   Vital Signs    Vitals:   06/21/20 0403 06/21/20 0413 06/21/20 0747 06/21/20 0813  BP: (!) 152/95   (!) 159/110  Pulse: 62   65  Resp: 18  14 20   Temp: 98.6 F (37 C)  97.8 F (36.6 C) 98.1 F (36.7 C)  TempSrc: Oral  Oral Oral  SpO2: 99%   100%  Weight:  78.1 kg    Height:        Intake/Output Summary (Last 24 hours) at 06/21/2020 0931 Last data filed at 06/21/2020 08/19/2020 Gross per 24 hour  Intake 1258.33 ml  Output 875 ml  Net 383.33 ml   Last 3 Weights 06/21/2020 06/19/2020 06/29/2019  Weight (lbs) 172 lb 1.6 oz 173 lb 163 lb  Weight (kg) 78.064 kg 78.472 kg 73.936 kg      Telemetry    NSR - Personally Reviewed  ECG      Physical Exam   GEN: No acute distress.   Neck: No JVD Cardiac: RRR, no murmurs, rubs, or gallops.  Respiratory: Clear to auscultation bilaterally. GI: Soft, nontender, non-distended  MS: No edema; No deformity.  2+ right radial pulse; no hematoma Neuro:  Nonfocal  Psych: Normal affect   Labs    High Sensitivity Troponin:   Recent Labs   Lab 06/19/20 1630 06/20/20 0054  TROPONINIHS 26,335* 22,681*      Chemistry Recent Labs  Lab 06/19/20 1630 06/20/20 0054 06/21/20 0142  NA 138 137 140  K 3.6 3.7 3.6  CL 102 106 108  CO2 24 21* 22  GLUCOSE 184* 231* 131*  BUN 17 19 17   CREATININE 1.24 1.31* 1.18  CALCIUM 9.3 9.1 9.3  PROT 6.2*  --   --   ALBUMIN 3.4*  --   --   AST 81*  --   --   ALT 26  --   --   ALKPHOS 58  --   --   BILITOT 0.8  --   --   GFRNONAA >60 59* >60  ANIONGAP 12 10 10      Hematology Recent Labs  Lab 06/19/20 0817 06/19/20 1630 06/20/20 0054  WBC  --  9.7 11.9*  RBC  --  4.41 4.32  HGB 13.3 14.0 13.2  HCT 39.0 38.5* 37.6*  MCV  --  87.3 87.0  MCH  --  31.7 30.6  MCHC  --  36.4* 35.1  RDW  --  13.3 13.1  PLT  --  283 284  BNPNo results for input(s): BNP, PROBNP in the last 168 hours.   DDimer No results for input(s): DDIMER in the last 168 hours.   Radiology    ECHOCARDIOGRAM COMPLETE  Result Date: 06/19/2020    ECHOCARDIOGRAM REPORT   Patient Name:   Wayne Marshall [place] Date of Exam: 06/19/2020 Medical Rec #:  [id number]         Height:       69.0 in Accession #:    [id number]        Weight:       163.0 lb Date of Birth:  08-09-1950         BSA:          1.894 m Patient Age:    69 years          BP:           142/72 mmHg Patient Gender: M                 HR:           54 bpm. Exam Location:  Inpatient Procedure: 2D Echo, Cardiac Doppler and Color Doppler Indications:    Acute myocardial infarction, unspecified I21.9  History:        Patient has no prior history of Echocardiogram examinations.                 Stroke; Risk Factors:Hypertension, Diabetes, Dyslipidemia and                 Non-Smoker.  Sonographer:    Renella Cunas RDCS Referring Phys: [phone] CHRISTOPHER END IMPRESSIONS  1. Left ventricular ejection fraction, by estimation, is 55 to 60%. The left ventricle has normal function. The left ventricle demonstrates regional wall motion abnormalities with basal to mid inferoseptal  hypokinesis and basal inferior hypokinesis. Left ventricular diastolic parameters are consistent with Grade II diastolic dysfunction (pseudonormalization).  2. Right ventricular systolic function is normal. The right ventricular size is normal. Tricuspid regurgitation signal is inadequate for assessing PA pressure.  3. The mitral valve is normal in structure. Trivial mitral valve regurgitation. No evidence of mitral stenosis.  4. The aortic valve is tricuspid. Aortic valve regurgitation is trivial. No aortic stenosis is present.  5. Aortic dilatation noted. There is mild dilatation of the aortic root, measuring 37 mm.  6. The inferior vena cava is normal in size with greater than 50% respiratory variability, suggesting right atrial pressure of 3 mmHg. FINDINGS  Left Ventricle: Left ventricular ejection fraction, by estimation, is 55 to 60%. The left ventricle has normal function. The left ventricle demonstrates regional wall motion abnormalities. The left ventricular internal cavity size was normal in size. There is no left ventricular hypertrophy. Left ventricular diastolic parameters are consistent with Grade II diastolic dysfunction (pseudonormalization). Right Ventricle: The right ventricular size is normal. No increase in right ventricular wall thickness. Right ventricular systolic function is normal. Tricuspid regurgitation signal is inadequate for assessing PA pressure. Left Atrium: Left atrial size was normal in size. Right Atrium: Right atrial size was normal in size. Pericardium: There is no evidence of pericardial effusion. Mitral Valve: The mitral valve is normal in structure. Trivial mitral valve regurgitation. No evidence of mitral valve stenosis. Tricuspid Valve: The tricuspid valve is normal in structure. Tricuspid valve regurgitation is not demonstrated. Aortic Valve: The aortic valve is tricuspid. Aortic valve regurgitation is trivial. No aortic stenosis is present. Pulmonic Valve: The pulmonic  valve was normal in structure. Pulmonic valve regurgitation is not visualized.  Aorta: The aortic root is normal in size and structure and aortic dilatation noted. There is mild dilatation of the aortic root, measuring 37 mm. Venous: The inferior vena cava is normal in size with greater than 50% respiratory variability, suggesting right atrial pressure of 3 mmHg. IAS/Shunts: No atrial level shunt detected by color flow Doppler.  LEFT VENTRICLE PLAX 2D LVIDd:         4.40 cm      Diastology LVIDs:         3.40 cm      LV e' medial:    6.05 cm/s LV PW:         0.90 cm      LV E/e' medial:  12.1 LV IVS:        0.90 cm      LV e' lateral:   7.36 cm/s LVOT diam:     2.20 cm      LV E/e' lateral: 10.0 LV SV:         76 LV SV Index:   40 LVOT Area:     3.80 cm  LV Volumes (MOD) LV vol d, MOD A2C: 124.0 ml LV vol d, MOD A4C: 126.0 ml LV vol s, MOD A2C: 59.5 ml LV vol s, MOD A4C: 57.0 ml LV SV MOD A2C:     64.5 ml LV SV MOD A4C:     126.0 ml LV SV MOD BP:      68.7 ml RIGHT VENTRICLE RV S prime:     10.40 cm/s TAPSE (M-mode): 2.4 cm LEFT ATRIUM             Index       RIGHT ATRIUM           Index LA diam:        3.00 cm 1.58 cm/m  RA Area:     14.20 cm LA Vol (A2C):   40.2 ml 21.23 ml/m RA Volume:   36.70 ml  19.38 ml/m LA Vol (A4C):   33.2 ml 17.53 ml/m LA Biplane Vol: 38.5 ml 20.33 ml/m  AORTIC VALVE LVOT Vmax:   89.80 cm/s LVOT Vmean:  56.200 cm/s LVOT VTI:    0.199 m  AORTA Ao Root diam: 3.60 cm MITRAL VALVE MV Area (PHT): 4.39 cm    SHUNTS MV Decel Time: 173 msec    Systemic VTI:  0.20 m MV E velocity: 73.40 cm/s  Systemic Diam: 2.20 cm MV A velocity: 68.20 cm/s MV E/A ratio:  1.08 Marca Ancona MD Electronically signed by Marca Ancona MD Signature Date/Time: 06/19/2020/4:26:15 PM    Final     Cardiac Studies   Cath films reviewed  Patient Profile     70 y.o. male wth inferior MI and residual CAD  Assessment & Plan    1) MI: No CHF.  Plan for PCI of LAD and possibly circumflex.  Prox LAD lesion which  supplies larger territory is a more important lesion.  THe circ is diffusely diseased but there is a focal area of severe stenosis in the mid vessel.  All qestions about the procedure explained to the patient.  He agrees to proceed.  If everything goes well, could consider discharge later this evening.    For questions or updates, please contact [facility] Please consult [email or web address] for contact info under        Signed, Lance Muss, MD  06/21/2020, 9:31 AM

## 2020-06-21 NOTE — Progress Notes (Signed)
CARDIAC REHAB PHASE I   Finished MI education with pt and wife. Reinforced importance of ASA, Brilinta, statin, and NTG. Pt given heart healthy and diabetic diets. Reviewed site care, restrictions, and exercise guidelines. Will refer to CRP II [place].  [id number] Reynold Bowen, RN BSN 06/21/2020 2:21 PM

## 2020-06-22 MED FILL — Nitroglycerin IV Soln 100 MCG/ML in D5W: INTRA_ARTERIAL | Qty: 10 | Status: AC

## 2020-06-23 LAB — POCT ACTIVATED CLOTTING TIME: Activated Clotting Time: 589 seconds

## 2020-06-26 ENCOUNTER — Telehealth (HOSPITAL_COMMUNITY): Payer: Self-pay

## 2020-06-26 NOTE — Telephone Encounter (Signed)
Per phase I, fax cardiac rehab referral to [facility].  

## 2020-06-28 ENCOUNTER — Other Ambulatory Visit: Payer: Self-pay

## 2020-06-28 DIAGNOSIS — I639 Cerebral infarction, unspecified: Secondary | ICD-10-CM | POA: Insufficient documentation

## 2020-06-28 DIAGNOSIS — E119 Type 2 diabetes mellitus without complications: Secondary | ICD-10-CM | POA: Insufficient documentation

## 2020-06-28 DIAGNOSIS — I10 Essential (primary) hypertension: Secondary | ICD-10-CM | POA: Insufficient documentation

## 2020-06-28 DIAGNOSIS — N189 Chronic kidney disease, unspecified: Secondary | ICD-10-CM | POA: Insufficient documentation

## 2020-06-29 ENCOUNTER — Other Ambulatory Visit: Payer: Self-pay

## 2020-06-29 ENCOUNTER — Ambulatory Visit: Payer: Medicare HMO | Admitting: Cardiology

## 2020-06-29 ENCOUNTER — Encounter: Payer: Self-pay | Admitting: Cardiology

## 2020-06-29 VITALS — BP 146/86 | HR 65 | Ht 69.0 in | Wt 172.4 lb

## 2020-06-29 DIAGNOSIS — E119 Type 2 diabetes mellitus without complications: Secondary | ICD-10-CM

## 2020-06-29 DIAGNOSIS — I2510 Atherosclerotic heart disease of native coronary artery without angina pectoris: Secondary | ICD-10-CM

## 2020-06-29 DIAGNOSIS — N183 Chronic kidney disease, stage 3 unspecified: Secondary | ICD-10-CM | POA: Diagnosis not present

## 2020-06-29 DIAGNOSIS — E1122 Type 2 diabetes mellitus with diabetic chronic kidney disease: Secondary | ICD-10-CM | POA: Diagnosis not present

## 2020-06-29 DIAGNOSIS — I10 Essential (primary) hypertension: Secondary | ICD-10-CM

## 2020-06-29 HISTORY — DX: Atherosclerotic heart disease of native coronary artery without angina pectoris: I25.10

## 2020-06-29 NOTE — Progress Notes (Signed)
Cardiology Office Note:    Date:  06/29/2020   ID:  Wayne Marshall, DOB 1950/08/30, MRN [id number]  PCP:  Abner Greenspan, MD  Cardiologist:  Garwin Brothers, MD   Referring MD: Abner Greenspan, MD    ASSESSMENT:    1. Primary hypertension   2. Coronary artery disease involving native coronary artery of native heart without angina pectoris   3. CKD stage 3 secondary to diabetes (HCC)   4. Diabetes mellitus without complication (HCC)    PLAN:    In order of problems listed above:  1. Coronary artery disease: Recent STEMI: I discussed my findings with the patient at extensive length.  Coronary angiography report was discussed with the patient at length and secondary prevention stressed.  Importance of compliance with diet medication stressed any vocalized understanding.  He was advised to start walking on a daily basis.  He takes care of activities of daily living without any problem.  He could go back and work in about 2 weeks from now if his graded exercise is fine.  I advised and educated him about cardiac rehab.  He was also educated about dual antiplatelet therapy. 2. Essential hypertension: Blood pressure stable and diet was emphasized. 3. Mixed dyslipidemia: Lipids were reviewed and I discussed this with him at length.  He is on statin therapy.  He will be back in a month for liver lipid check follow-up. 4. Diabetes mellitus: Managed by primary care physician. 5. Patient will be seen in follow-up appointment in 2 months or earlier if the patient has any concerns    Medication Adjustments/Labs and Tests Ordered: Current medicines are reviewed at length with the patient today.  Concerns regarding medicines are outlined above.  No orders of the defined types were placed in this encounter.  No orders of the defined types were placed in this encounter.    No chief complaint on file.    History of Present Illness:    Wayne Marshall is a 70 y.o. male.  Patient has past  medical history of essential hypertension, renal insufficiency and diabetes mellitus.  He recently had a LAD stenting for STEMI.  Subsequently is done fine.  No chest pain orthopnea PND.  He is here for follow-up.  He is accompanied by his daughter is very supportive.  At the time of my evaluation, the patient is alert awake oriented and in no distress.  Past Medical History:  Diagnosis Date  . Acute respiratory failure with hypoxia (HCC) 06/30/2019  . AKI (acute kidney injury) (HCC) 06/30/2019  . Chronic kidney disease   . CKD stage 3 secondary to diabetes (HCC) 06/30/2019  . COVID-19 2021  . Diabetes mellitus without complication (HCC)   . DM2 (diabetes mellitus, type 2) (HCC) 06/30/2019  . HTN (hypertension) 06/30/2019  . Hypertension   . Hypoxemia   . Impingement syndrome of left shoulder region 11/12/2017  . Pneumonia due to COVID-19 virus 06/30/2019  . STEMI (ST elevation myocardial infarction) (HCC) 06/19/2020  . 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 Stroke [facility])     Past Surgical History:  Procedure Laterality Date  . CORONARY STENT INTERVENTION N/A 06/19/2020   Procedure: CORONARY STENT INTERVENTION;  Surgeon: Yvonne Kendall, MD;  Location: [facility] INVASIVE CV LAB;  Service: Cardiovascular;  Laterality: N/A;  . CORONARY STENT INTERVENTION N/A 06/21/2020   Procedure: CORONARY STENT INTERVENTION;  Surgeon: Iran Ouch, MD;  Location: [facility] INVASIVE CV LAB;  Service: Cardiovascular;  Laterality: N/A;  .  INTRAVASCULAR ULTRASOUND/IVUS N/A 06/21/2020   Procedure: Intravascular Ultrasound/IVUS;  Surgeon: Iran Ouch, MD;  Location: [facility] INVASIVE CV LAB;  Service: Cardiovascular;  Laterality: N/A;  . LEFT HEART CATH AND CORONARY ANGIOGRAPHY N/A 06/19/2020   Procedure: LEFT HEART CATH AND CORONARY ANGIOGRAPHY;  Surgeon: Yvonne Kendall, MD;  Location: [facility] INVASIVE CV LAB;  Service: Cardiovascular;  Laterality: N/A;    Current Medications: Current Meds  Medication Sig  .  allopurinol (ZYLOPRIM) 100 MG tablet Take 100 mg by mouth daily.  Marland Kitchen aspirin 81 MG chewable tablet Chew 1 tablet (81 mg total) by mouth daily.  Marland Kitchen atorvastatin (LIPITOR) 80 MG tablet Take 1 tablet (80 mg total) by mouth daily.  . carvedilol (COREG) 6.25 MG tablet Take 6.25 mg by mouth 2 (two) times daily with a meal.  . guaiFENesin-dextromethorphan (ROBITUSSIN DM) 100-10 MG/5ML syrup Take 10 mLs by mouth every 4 (four) hours as needed for cough.  . Ipratropium-Albuterol (COMBIVENT) 20-100 MCG/ACT AERS respimat Inhale 1 puff into the lungs every 6 (six) hours as needed for wheezing or shortness of breath.  . metFORMIN (GLUCOPHAGE) 1000 MG tablet Take 1,000 mg by mouth 2 (two) times daily with a meal.  . Multiple Vitamin (MULTIVITAMIN WITH MINERALS) TABS tablet Take 1 tablet by mouth daily.  . nitroGLYCERIN (NITROSTAT) 0.4 MG SL tablet Place 0.4 mg under the tongue every 5 (five) minutes as needed for chest pain.  Marland Kitchen Psyllium (METAMUCIL FIBER PO) Take 1 tablet by mouth at bedtime.  . ticagrelor (BRILINTA) 90 MG TABS tablet Take 1 tablet (90 mg total) by mouth 2 (two) times daily.  . valsartan-hydrochlorothiazide (DIOVAN-HCT) 320-12.5 MG tablet Take 1 tablet by mouth daily.     Allergies:   Patient has no known allergies.   Social History   Socioeconomic History  . Marital status: Married    Spouse name: margie Voorhis  . Number of children: Not on file  . Years of education: Not on file  . Highest education level: Not on file  Occupational History  . Not on file  Tobacco Use  . Smoking status: Never Smoker  . Smokeless tobacco: Never Used  Vaping Use  . Vaping Use: Never used  Substance and Sexual Activity  . Alcohol use: Never  . Drug use: Never  . Sexual activity: Never  Other Topics Concern  . Not on file  Social History Narrative  . Not on file   Social Determinants of Health   Financial Resource Strain: Not on file  Food Insecurity: Not on file  Transportation Needs: Not on  file  Physical Activity: Not on file  Stress: Not on file  Social Connections: Not on file     Family History: The patient's family history includes Cancer in his father. There is no history of Heart disease.  ROS:   Please see the history of present illness.    All other systems reviewed and are negative.  EKGs/Labs/Other Studies Reviewed:    The following studies were reviewed today:  EKG reveals sinus rhythm and nonspecific ST-T changes.   Iran Ouch, MD (Primary)      Procedures  CORONARY STENT INTERVENTION  Intravascular Ultrasound/IVUS   Conclusion    1st Diag lesion is 50% stenosed.  Prox LAD lesion is 80% stenosed.  1st Mrg lesion is 90% stenosed.  Mid Cx lesion is 90% stenosed.  Ost Cx to Prox Cx lesion is 70% stenosed.  Mid LAD lesion is 50% stenosed.  A drug-eluting stent was successfully placed  using a SYNERGY XD [id number].  Post intervention, there is a 0% residual stenosis.  Post intervention, there is a 0% residual stenosis.   Successful OCT-guided angioplasty and drug-eluting stent placement to the proximal LAD.  Recommendations: Initial plans were to possibly intervene on the left circumflex but angiography showed diffuse disease extending all the way back to the ostium with relatively small vessel diameter.  I felt that medical therapy is probably best and reserving PCI for refractory anginal symptoms.  The patient is already on dual antiplatelet therapy. He can be discharged home with no other issues.  IMPRESSIONS    1. Left ventricular ejection fraction, by estimation, is 55 to 60%. The  left ventricle has normal function. The left ventricle demonstrates  regional wall motion abnormalities with basal to mid inferoseptal  hypokinesis and basal inferior hypokinesis.  Left ventricular diastolic parameters are consistent with Grade II  diastolic dysfunction (pseudonormalization).  2. Right ventricular systolic function is  normal. The right ventricular  size is normal. Tricuspid regurgitation signal is inadequate for assessing  PA pressure.  3. The mitral valve is normal in structure. Trivial mitral valve  regurgitation. No evidence of mitral stenosis.  4. The aortic valve is tricuspid. Aortic valve regurgitation is trivial.  No aortic stenosis is present.  5. Aortic dilatation noted. There is mild dilatation of the aortic root,  measuring 37 mm.  6. The inferior vena cava is normal in size with greater than 50%  respiratory variability, suggesting right atrial pressure of 3 mmHg.       Recent Labs: 06/19/2020: ALT 26; TSH 1.989 06/20/2020: Hemoglobin 13.2; Platelets 284 06/21/2020: BUN 17; Creatinine, Ser 1.18; Potassium 3.6; Sodium 140  Recent Lipid Panel    Component Value Date/Time   CHOL 134 06/19/2020 1630   TRIG 108 06/19/2020 1630   HDL 39 (L) 06/19/2020 1630   CHOLHDL 3.4 06/19/2020 1630   VLDL 22 06/19/2020 1630   LDLCALC 73 06/19/2020 1630    Physical Exam:    VS:  BP (!) 146/86   Pulse 65   Ht 5\' 9"  (1.753 m)   Wt 172 lb 6.4 oz (78.2 kg)   SpO2 99%   BMI 25.46 kg/m     Wt Readings from Last 3 Encounters:  06/29/20 172 lb 6.4 oz (78.2 kg)  06/21/20 172 lb 1.6 oz (78.1 kg)  06/29/19 163 lb (73.9 kg)     GEN: Patient is in no acute distress HEENT: Normal NECK: No JVD; No carotid bruits LYMPHATICS: No lymphadenopathy CARDIAC: Hear sounds regular, 2/6 systolic murmur at the apex. RESPIRATORY:  Clear to auscultation without rales, wheezing or rhonchi  ABDOMEN: Soft, non-tender, non-distended MUSCULOSKELETAL:  No edema; No deformity  SKIN: Warm and dry NEUROLOGIC:  Alert and oriented x 3 PSYCHIATRIC:  Normal affect   Signed, 07/01/19, MD  06/29/2020 1:59 PM    [facility]

## 2020-06-29 NOTE — Patient Instructions (Signed)
Medication Instructions:  No medication changes. *If you need a refill on your cardiac medications before your next appointment, please call your pharmacy*   Lab Work: Your physician recommends that you return for lab work in: 1 month  You need to have labs done when you are fasting.  You can come Monday through Friday 8:30 am to 12:00 pm and 1:15 to 4:30. You do not need to make an appointment as the order has already been placed. The labs you are going to have done are BMET, LFT and Lipids.  If you have labs (blood work) drawn today and your tests are completely normal, you will receive your results only by: Marland Kitchen MyChart Message (if you have MyChart) OR . A paper copy in the mail If you have any lab test that is abnormal or we need to change your treatment, we will call you to review the results.   Testing/Procedures: None ordered   Follow-Up: At [facility], you and your health needs are our priority.  As part of our continuing mission to provide you with exceptional heart care, we have created designated Provider Care Teams.  These Care Teams include your primary Cardiologist (physician) and Advanced Practice Providers (APPs -  Physician Assistants and Nurse Practitioners) who all work together to provide you with the care you need, when you need it.  We recommend signing up for the patient portal called "MyChart".  Sign up information is provided on this After Visit Summary.  MyChart is used to connect with patients for Virtual Visits (Telemedicine).  Patients are able to view lab/test results, encounter notes, upcoming appointments, etc.  Non-urgent messages can be sent to your provider as well.   To learn more about what you can do with MyChart, go to [email or web address].    Your next appointment:   2 month(s)  The format for your next appointment:   In Person  Provider:   Belva Crome, MD   Other Instructions NA

## 2020-06-30 DIAGNOSIS — E1169 Type 2 diabetes mellitus with other specified complication: Secondary | ICD-10-CM | POA: Diagnosis not present

## 2020-06-30 DIAGNOSIS — E1129 Type 2 diabetes mellitus with other diabetic kidney complication: Secondary | ICD-10-CM | POA: Diagnosis not present

## 2020-06-30 DIAGNOSIS — I2510 Atherosclerotic heart disease of native coronary artery without angina pectoris: Secondary | ICD-10-CM | POA: Diagnosis not present

## 2020-06-30 DIAGNOSIS — E785 Hyperlipidemia, unspecified: Secondary | ICD-10-CM | POA: Diagnosis not present

## 2020-07-05 ENCOUNTER — Telehealth (HOSPITAL_COMMUNITY): Payer: Self-pay

## 2020-07-05 DIAGNOSIS — I2510 Atherosclerotic heart disease of native coronary artery without angina pectoris: Secondary | ICD-10-CM | POA: Diagnosis not present

## 2020-07-05 DIAGNOSIS — E1122 Type 2 diabetes mellitus with diabetic chronic kidney disease: Secondary | ICD-10-CM | POA: Diagnosis not present

## 2020-07-05 DIAGNOSIS — E119 Type 2 diabetes mellitus without complications: Secondary | ICD-10-CM | POA: Diagnosis not present

## 2020-07-05 DIAGNOSIS — N183 Chronic kidney disease, stage 3 unspecified: Secondary | ICD-10-CM | POA: Diagnosis not present

## 2020-07-05 DIAGNOSIS — I10 Essential (primary) hypertension: Secondary | ICD-10-CM | POA: Diagnosis not present

## 2020-07-05 LAB — LIPID PANEL
Chol/HDL Ratio: 3.3 ratio (ref 0.0–5.0)
Cholesterol, Total: 82 mg/dL — ABNORMAL LOW (ref 100–199)
HDL: 25 mg/dL — ABNORMAL LOW (ref >39)
LDL Chol Calc (NIH): 38 mg/dL (ref 0–99)
Triglycerides: 96 mg/dL (ref 0–149)
VLDL Cholesterol Cal: 19 mg/dL (ref 5–40)

## 2020-07-05 LAB — BASIC METABOLIC PANEL
BUN/Creatinine Ratio: 18 (ref 10–24)
BUN: 28 mg/dL — ABNORMAL HIGH (ref 8–27)
CO2: 23 mmol/L (ref 20–29)
Calcium: 10.2 mg/dL (ref 8.6–10.2)
Chloride: 103 mmol/L (ref 96–106)
Creatinine, Ser: 1.54 mg/dL — ABNORMAL HIGH (ref 0.76–1.27)
GFR calc Af Amer: 52 mL/min/{1.73_m2} — ABNORMAL LOW (ref >59)
GFR calc non Af Amer: 45 mL/min/{1.73_m2} — ABNORMAL LOW (ref >59)
Glucose: 117 mg/dL — ABNORMAL HIGH (ref 65–99)
Potassium: 3.6 mmol/L (ref 3.5–5.2)
Sodium: 139 mmol/L (ref 134–144)

## 2020-07-05 LAB — HEPATIC FUNCTION PANEL
ALT: 19 IU/L (ref 0–44)
AST: 20 IU/L (ref 0–40)
Albumin: 4.3 g/dL (ref 3.8–4.8)
Alkaline Phosphatase: 90 IU/L (ref 44–121)
Bilirubin Total: 0.7 mg/dL (ref 0.0–1.2)
Bilirubin, Direct: 0.21 mg/dL (ref 0.00–0.40)
Total Protein: 7 g/dL (ref 6.0–8.5)

## 2020-07-05 NOTE — Telephone Encounter (Signed)
Transitions of Care Pharmacy   Call attempted for a pharmacy transitions of care follow-up. HIPAA appropriate voicemail was left with call back information provided.   Call attempt #3. Will no longer attempt follow up for pharmacy TOC.     

## 2020-07-10 DIAGNOSIS — N289 Disorder of kidney and ureter, unspecified: Secondary | ICD-10-CM

## 2020-07-11 ENCOUNTER — Telehealth: Payer: Self-pay

## 2020-07-11 DIAGNOSIS — I2510 Atherosclerotic heart disease of native coronary artery without angina pectoris: Secondary | ICD-10-CM

## 2020-07-11 NOTE — Telephone Encounter (Signed)
Returning Shonda's call about lab results.  Reviewed Dr. Leisa Lenz notes about labs done and patient to stay hydrated and recheck chem 7 in 10 days.

## 2020-07-17 DIAGNOSIS — I2510 Atherosclerotic heart disease of native coronary artery without angina pectoris: Secondary | ICD-10-CM | POA: Diagnosis not present

## 2020-07-17 LAB — BASIC METABOLIC PANEL
BUN/Creatinine Ratio: 17 (ref 10–24)
BUN: 23 mg/dL (ref 8–27)
CO2: 20 mmol/L (ref 20–29)
Calcium: 9.9 mg/dL (ref 8.6–10.2)
Chloride: 106 mmol/L (ref 96–106)
Creatinine, Ser: 1.35 mg/dL — ABNORMAL HIGH (ref 0.76–1.27)
Glucose: 136 mg/dL — ABNORMAL HIGH (ref 65–99)
Potassium: 4 mmol/L (ref 3.5–5.2)
Sodium: 141 mmol/L (ref 134–144)
eGFR: 56 mL/min/{1.73_m2} — ABNORMAL LOW (ref >59)

## 2020-07-18 DIAGNOSIS — Z955 Presence of coronary angioplasty implant and graft: Secondary | ICD-10-CM | POA: Diagnosis not present

## 2020-07-18 DIAGNOSIS — E785 Hyperlipidemia, unspecified: Secondary | ICD-10-CM | POA: Diagnosis not present

## 2020-07-18 DIAGNOSIS — I129 Hypertensive chronic kidney disease with stage 1 through stage 4 chronic kidney disease, or unspecified chronic kidney disease: Secondary | ICD-10-CM | POA: Diagnosis not present

## 2020-07-18 DIAGNOSIS — N183 Chronic kidney disease, stage 3 unspecified: Secondary | ICD-10-CM | POA: Diagnosis not present

## 2020-07-18 DIAGNOSIS — E1122 Type 2 diabetes mellitus with diabetic chronic kidney disease: Secondary | ICD-10-CM | POA: Diagnosis not present

## 2020-07-18 DIAGNOSIS — I252 Old myocardial infarction: Secondary | ICD-10-CM | POA: Diagnosis not present

## 2020-07-19 DIAGNOSIS — N1831 Chronic kidney disease, stage 3a: Secondary | ICD-10-CM | POA: Diagnosis not present

## 2020-07-19 DIAGNOSIS — I2510 Atherosclerotic heart disease of native coronary artery without angina pectoris: Secondary | ICD-10-CM | POA: Diagnosis not present

## 2020-07-19 DIAGNOSIS — N183 Chronic kidney disease, stage 3 unspecified: Secondary | ICD-10-CM | POA: Diagnosis not present

## 2020-07-19 DIAGNOSIS — E1122 Type 2 diabetes mellitus with diabetic chronic kidney disease: Secondary | ICD-10-CM | POA: Diagnosis not present

## 2020-07-19 DIAGNOSIS — Z6824 Body mass index (BMI) 24.0-24.9, adult: Secondary | ICD-10-CM | POA: Diagnosis not present

## 2020-07-19 DIAGNOSIS — I129 Hypertensive chronic kidney disease with stage 1 through stage 4 chronic kidney disease, or unspecified chronic kidney disease: Secondary | ICD-10-CM | POA: Diagnosis not present

## 2020-07-19 DIAGNOSIS — Z955 Presence of coronary angioplasty implant and graft: Secondary | ICD-10-CM | POA: Diagnosis not present

## 2020-07-19 DIAGNOSIS — E785 Hyperlipidemia, unspecified: Secondary | ICD-10-CM | POA: Diagnosis not present

## 2020-07-19 DIAGNOSIS — I252 Old myocardial infarction: Secondary | ICD-10-CM | POA: Diagnosis not present

## 2020-07-21 DIAGNOSIS — E1122 Type 2 diabetes mellitus with diabetic chronic kidney disease: Secondary | ICD-10-CM | POA: Diagnosis not present

## 2020-07-21 DIAGNOSIS — I129 Hypertensive chronic kidney disease with stage 1 through stage 4 chronic kidney disease, or unspecified chronic kidney disease: Secondary | ICD-10-CM | POA: Diagnosis not present

## 2020-07-21 DIAGNOSIS — Z955 Presence of coronary angioplasty implant and graft: Secondary | ICD-10-CM | POA: Diagnosis not present

## 2020-07-21 DIAGNOSIS — N183 Chronic kidney disease, stage 3 unspecified: Secondary | ICD-10-CM | POA: Diagnosis not present

## 2020-07-21 DIAGNOSIS — I252 Old myocardial infarction: Secondary | ICD-10-CM | POA: Diagnosis not present

## 2020-07-21 DIAGNOSIS — E785 Hyperlipidemia, unspecified: Secondary | ICD-10-CM | POA: Diagnosis not present

## 2020-07-24 DIAGNOSIS — I252 Old myocardial infarction: Secondary | ICD-10-CM | POA: Diagnosis not present

## 2020-07-24 DIAGNOSIS — E1122 Type 2 diabetes mellitus with diabetic chronic kidney disease: Secondary | ICD-10-CM | POA: Diagnosis not present

## 2020-07-24 DIAGNOSIS — Z955 Presence of coronary angioplasty implant and graft: Secondary | ICD-10-CM | POA: Diagnosis not present

## 2020-07-24 DIAGNOSIS — E785 Hyperlipidemia, unspecified: Secondary | ICD-10-CM | POA: Diagnosis not present

## 2020-07-24 DIAGNOSIS — N183 Chronic kidney disease, stage 3 unspecified: Secondary | ICD-10-CM | POA: Diagnosis not present

## 2020-07-24 DIAGNOSIS — I129 Hypertensive chronic kidney disease with stage 1 through stage 4 chronic kidney disease, or unspecified chronic kidney disease: Secondary | ICD-10-CM | POA: Diagnosis not present

## 2020-07-26 DIAGNOSIS — I252 Old myocardial infarction: Secondary | ICD-10-CM | POA: Diagnosis not present

## 2020-07-26 DIAGNOSIS — E785 Hyperlipidemia, unspecified: Secondary | ICD-10-CM | POA: Diagnosis not present

## 2020-07-26 DIAGNOSIS — N183 Chronic kidney disease, stage 3 unspecified: Secondary | ICD-10-CM | POA: Diagnosis not present

## 2020-07-26 DIAGNOSIS — E1122 Type 2 diabetes mellitus with diabetic chronic kidney disease: Secondary | ICD-10-CM | POA: Diagnosis not present

## 2020-07-26 DIAGNOSIS — I129 Hypertensive chronic kidney disease with stage 1 through stage 4 chronic kidney disease, or unspecified chronic kidney disease: Secondary | ICD-10-CM | POA: Diagnosis not present

## 2020-07-26 DIAGNOSIS — Z955 Presence of coronary angioplasty implant and graft: Secondary | ICD-10-CM | POA: Diagnosis not present

## 2020-07-28 DIAGNOSIS — N183 Chronic kidney disease, stage 3 unspecified: Secondary | ICD-10-CM | POA: Diagnosis not present

## 2020-07-28 DIAGNOSIS — E1122 Type 2 diabetes mellitus with diabetic chronic kidney disease: Secondary | ICD-10-CM | POA: Diagnosis not present

## 2020-07-28 DIAGNOSIS — Z955 Presence of coronary angioplasty implant and graft: Secondary | ICD-10-CM | POA: Diagnosis not present

## 2020-07-28 DIAGNOSIS — E785 Hyperlipidemia, unspecified: Secondary | ICD-10-CM | POA: Diagnosis not present

## 2020-07-28 DIAGNOSIS — I252 Old myocardial infarction: Secondary | ICD-10-CM | POA: Diagnosis not present

## 2020-07-28 DIAGNOSIS — I129 Hypertensive chronic kidney disease with stage 1 through stage 4 chronic kidney disease, or unspecified chronic kidney disease: Secondary | ICD-10-CM | POA: Diagnosis not present

## 2020-07-30 IMAGING — US US RENAL
1 series · 14 of 25 positions shown · non-contrast
Comparison: None

CLINICAL DATA: Acute kidney injury

EXAM:
RENAL / URINARY TRACT ULTRASOUND COMPLETE

[Series 1: us renal · 0.25mm/px · 14 of 45 slices shown]
[im 1/45]
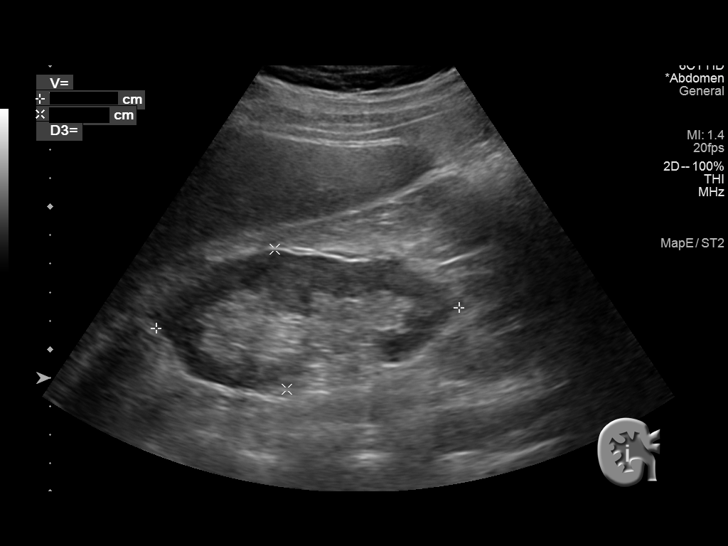
[im 4/45]
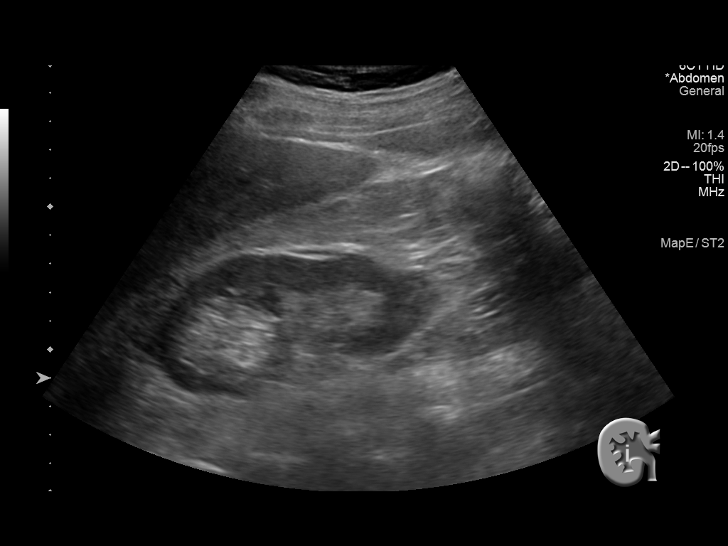
[im 8/45]
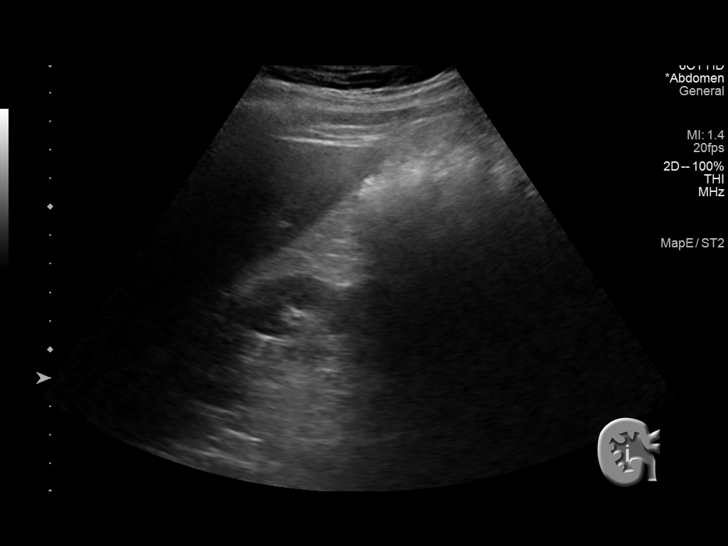
[im 12/45]
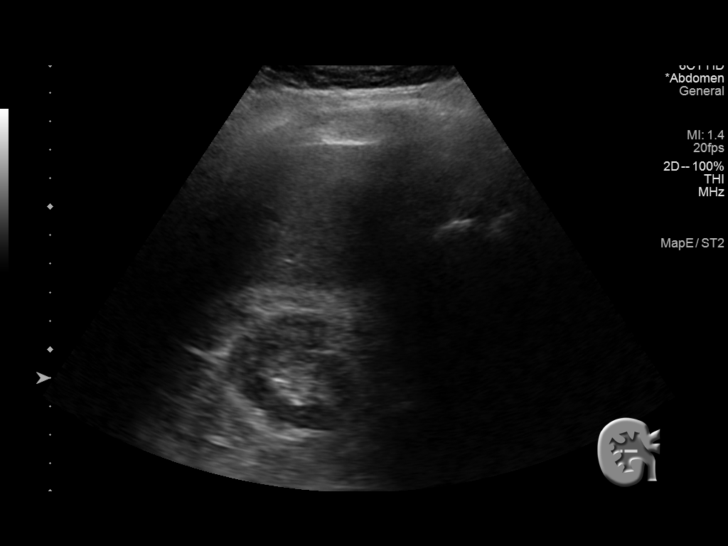
[im 15/45]
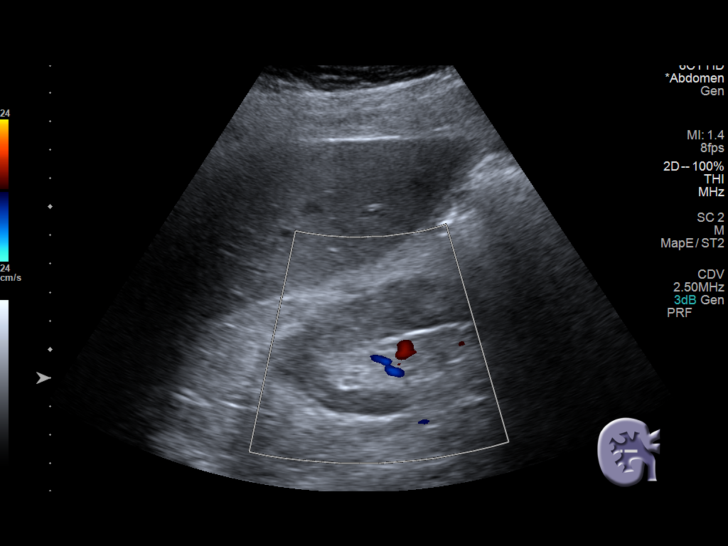
[im 17/45]
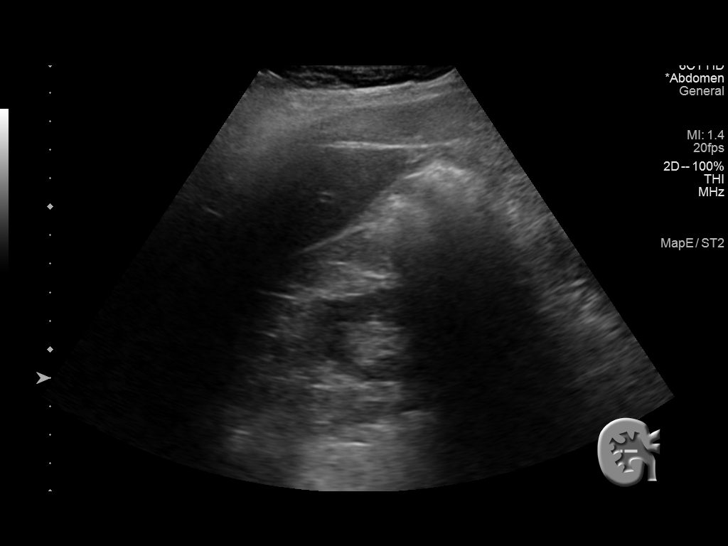
[im 21/45]
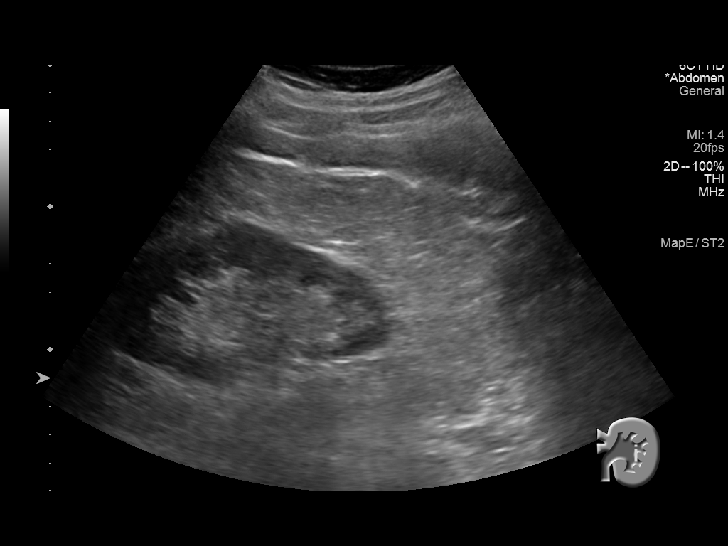
[im 24/45]
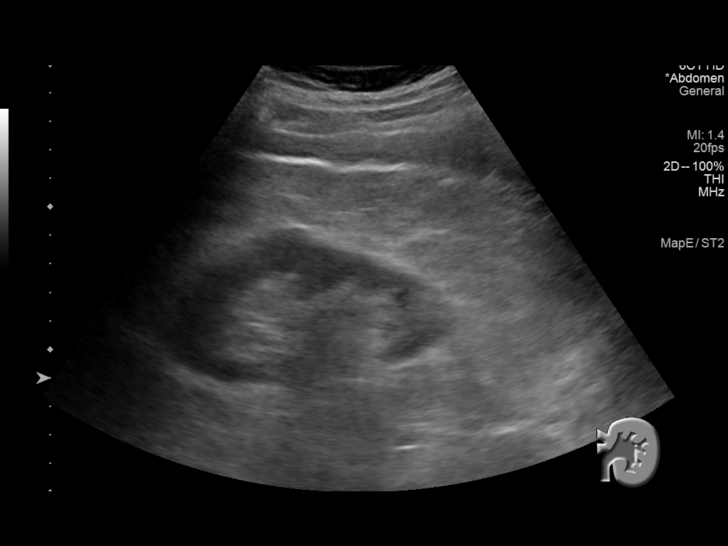
[im 28/45]
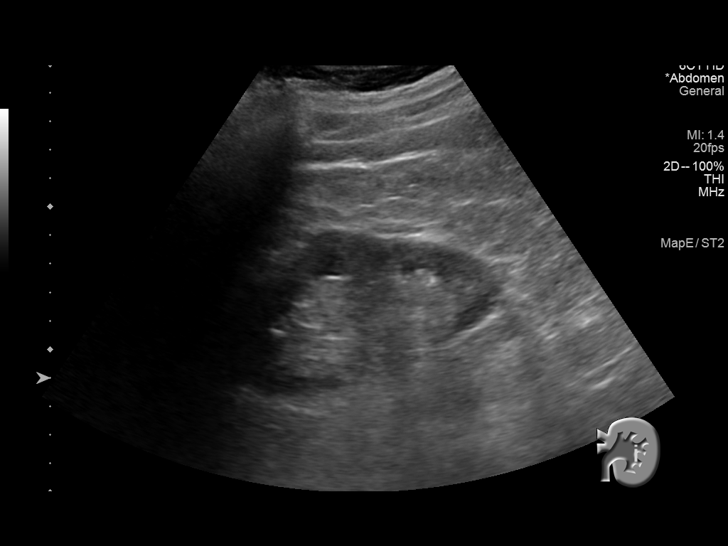
[im 30/45]
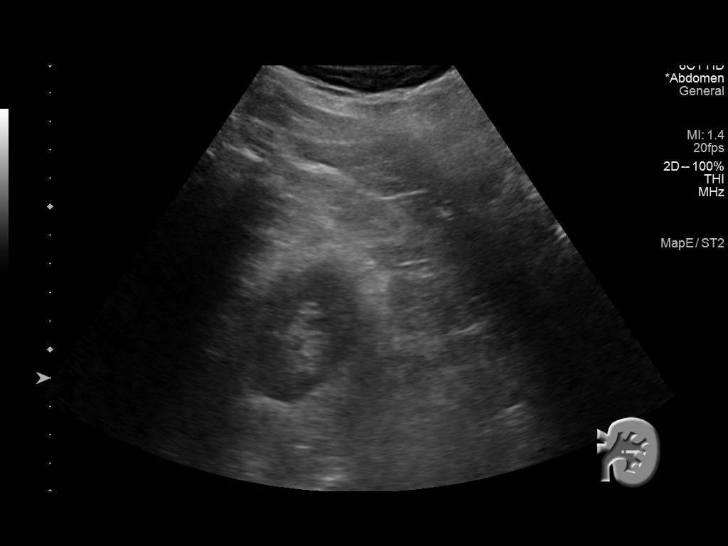
[im 34/45]
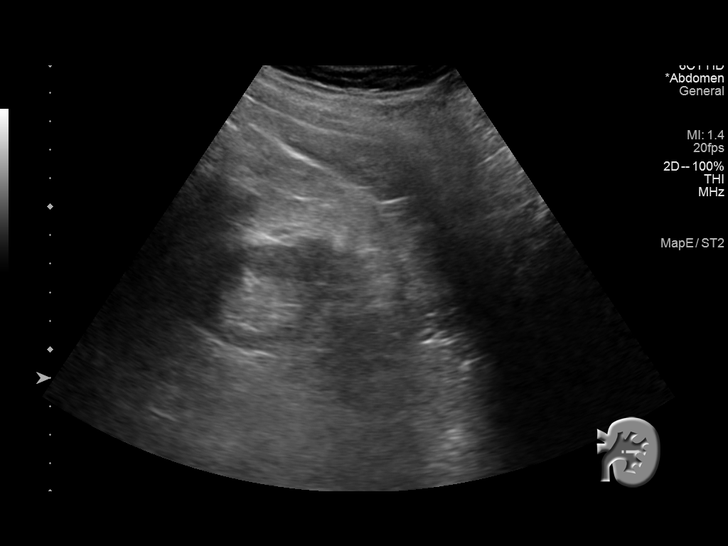
[im 37/45]
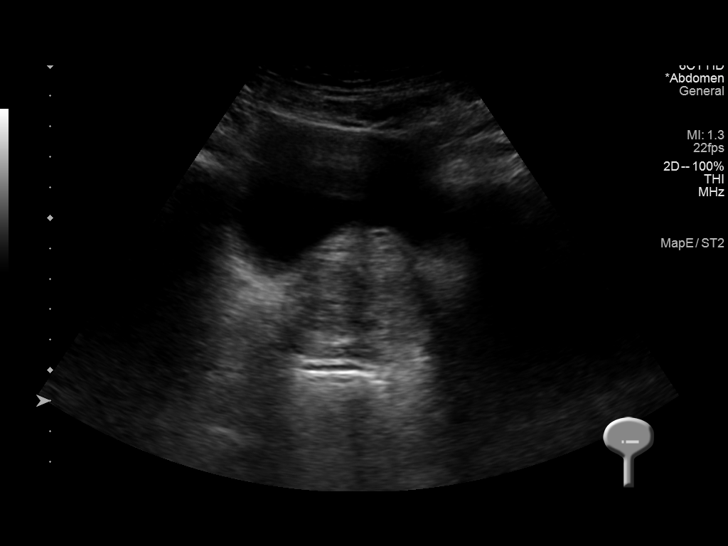
[im 41/45]
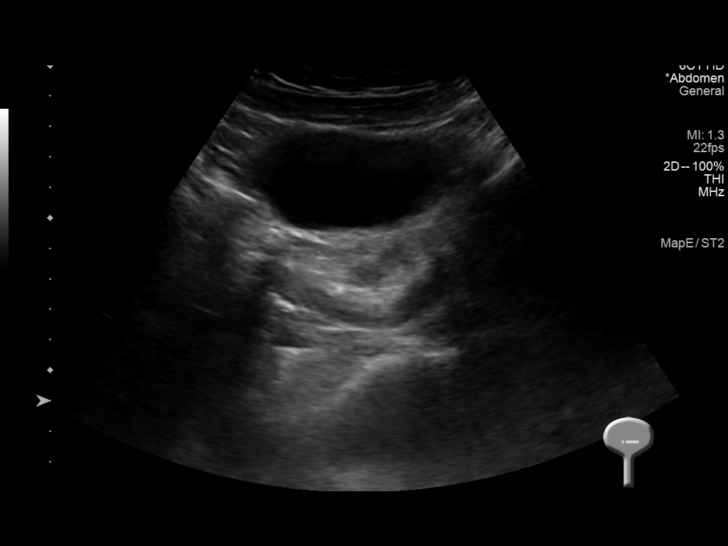
[im 45/45]
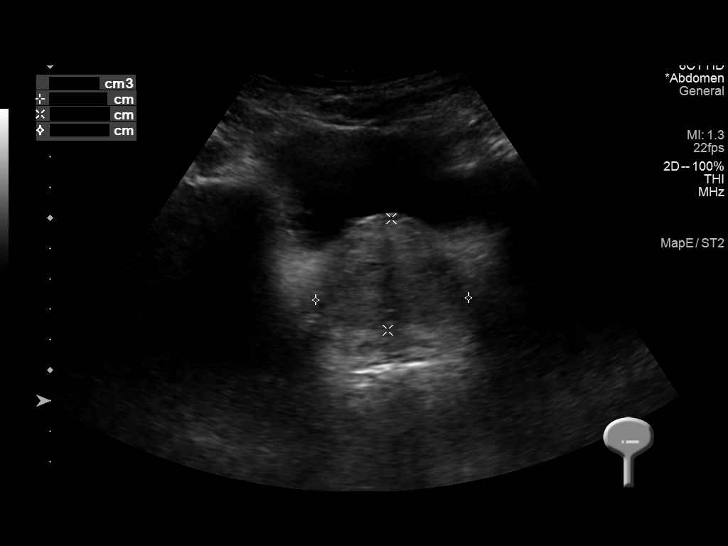

[14 of 25 positions shown; findings below may reference images not displayed]

FINDINGS: Right Kidney:

Renal measurements: 10.6 x 4.9 x 5.6 cm. = volume: 153.3 mL .
Echogenicity within normal limits. No mass or hydronephrosis
visualized.

Left Kidney:

Renal measurements: 10.6 x 5.0 x 4.2 cm = volume: 115.1 mL.
Echogenicity within normal limits. No mass or hydronephrosis
visualized.

Bladder:

Appears normal for degree of bladder distention.

Other:

Prostate gland measures 4.2 x 3.7 x 5.0 cm with a volume of 40.6 cc.
IMPRESSION: 1. No acute abnormality.
2. No hydronephrosis.

## 2020-07-31 DIAGNOSIS — I252 Old myocardial infarction: Secondary | ICD-10-CM | POA: Diagnosis not present

## 2020-07-31 DIAGNOSIS — Z955 Presence of coronary angioplasty implant and graft: Secondary | ICD-10-CM | POA: Diagnosis not present

## 2020-07-31 DIAGNOSIS — N183 Chronic kidney disease, stage 3 unspecified: Secondary | ICD-10-CM | POA: Diagnosis not present

## 2020-07-31 DIAGNOSIS — E785 Hyperlipidemia, unspecified: Secondary | ICD-10-CM | POA: Diagnosis not present

## 2020-07-31 DIAGNOSIS — I129 Hypertensive chronic kidney disease with stage 1 through stage 4 chronic kidney disease, or unspecified chronic kidney disease: Secondary | ICD-10-CM | POA: Diagnosis not present

## 2020-07-31 DIAGNOSIS — E1122 Type 2 diabetes mellitus with diabetic chronic kidney disease: Secondary | ICD-10-CM | POA: Diagnosis not present

## 2020-08-02 DIAGNOSIS — E785 Hyperlipidemia, unspecified: Secondary | ICD-10-CM | POA: Diagnosis not present

## 2020-08-02 DIAGNOSIS — I252 Old myocardial infarction: Secondary | ICD-10-CM | POA: Diagnosis not present

## 2020-08-02 DIAGNOSIS — E1122 Type 2 diabetes mellitus with diabetic chronic kidney disease: Secondary | ICD-10-CM | POA: Diagnosis not present

## 2020-08-02 DIAGNOSIS — I129 Hypertensive chronic kidney disease with stage 1 through stage 4 chronic kidney disease, or unspecified chronic kidney disease: Secondary | ICD-10-CM | POA: Diagnosis not present

## 2020-08-02 DIAGNOSIS — Z955 Presence of coronary angioplasty implant and graft: Secondary | ICD-10-CM | POA: Diagnosis not present

## 2020-08-02 DIAGNOSIS — N183 Chronic kidney disease, stage 3 unspecified: Secondary | ICD-10-CM | POA: Diagnosis not present

## 2020-08-04 DIAGNOSIS — I129 Hypertensive chronic kidney disease with stage 1 through stage 4 chronic kidney disease, or unspecified chronic kidney disease: Secondary | ICD-10-CM | POA: Diagnosis not present

## 2020-08-04 DIAGNOSIS — N183 Chronic kidney disease, stage 3 unspecified: Secondary | ICD-10-CM | POA: Diagnosis not present

## 2020-08-04 DIAGNOSIS — I252 Old myocardial infarction: Secondary | ICD-10-CM | POA: Diagnosis not present

## 2020-08-04 DIAGNOSIS — Z955 Presence of coronary angioplasty implant and graft: Secondary | ICD-10-CM | POA: Diagnosis not present

## 2020-08-04 DIAGNOSIS — E1122 Type 2 diabetes mellitus with diabetic chronic kidney disease: Secondary | ICD-10-CM | POA: Diagnosis not present

## 2020-08-04 DIAGNOSIS — E785 Hyperlipidemia, unspecified: Secondary | ICD-10-CM | POA: Diagnosis not present

## 2020-08-07 ENCOUNTER — Other Ambulatory Visit: Payer: Self-pay

## 2020-08-07 ENCOUNTER — Telehealth: Payer: Self-pay | Admitting: Physician Assistant

## 2020-08-07 DIAGNOSIS — N183 Chronic kidney disease, stage 3 unspecified: Secondary | ICD-10-CM | POA: Diagnosis not present

## 2020-08-07 DIAGNOSIS — E785 Hyperlipidemia, unspecified: Secondary | ICD-10-CM | POA: Diagnosis not present

## 2020-08-07 DIAGNOSIS — E1122 Type 2 diabetes mellitus with diabetic chronic kidney disease: Secondary | ICD-10-CM | POA: Diagnosis not present

## 2020-08-07 DIAGNOSIS — I129 Hypertensive chronic kidney disease with stage 1 through stage 4 chronic kidney disease, or unspecified chronic kidney disease: Secondary | ICD-10-CM | POA: Diagnosis not present

## 2020-08-07 DIAGNOSIS — I252 Old myocardial infarction: Secondary | ICD-10-CM | POA: Diagnosis not present

## 2020-08-07 DIAGNOSIS — Z955 Presence of coronary angioplasty implant and graft: Secondary | ICD-10-CM | POA: Diagnosis not present

## 2020-08-07 MED ORDER — TICAGRELOR 90 MG PO TABS: 90.0000 mg | ORAL_TABLET | Freq: Two times a day (BID) | ORAL | 2 refills | Status: DC

## 2020-08-07 NOTE — Telephone Encounter (Signed)
RX sent to [facility].  

## 2020-08-07 NOTE — Telephone Encounter (Signed)
Paged by [facility] pharmacist that patient ran out of Brilinta prescription.  Patient wants to send prescription to [facility].  I have instructed pharmacist to let patient know that he needs to call our office to possibly get sample of medication while waiting for prescription.  Pharmacist return call stating that he was unable to reach out to patient.  I will forward message to covering staff of [name]. Please arrange samples and prescription

## 2020-08-09 DIAGNOSIS — E1122 Type 2 diabetes mellitus with diabetic chronic kidney disease: Secondary | ICD-10-CM | POA: Diagnosis not present

## 2020-08-09 DIAGNOSIS — N183 Chronic kidney disease, stage 3 unspecified: Secondary | ICD-10-CM | POA: Diagnosis not present

## 2020-08-09 DIAGNOSIS — I252 Old myocardial infarction: Secondary | ICD-10-CM | POA: Diagnosis not present

## 2020-08-09 DIAGNOSIS — I129 Hypertensive chronic kidney disease with stage 1 through stage 4 chronic kidney disease, or unspecified chronic kidney disease: Secondary | ICD-10-CM | POA: Diagnosis not present

## 2020-08-09 DIAGNOSIS — Z955 Presence of coronary angioplasty implant and graft: Secondary | ICD-10-CM | POA: Diagnosis not present

## 2020-08-09 DIAGNOSIS — E785 Hyperlipidemia, unspecified: Secondary | ICD-10-CM | POA: Diagnosis not present

## 2020-08-11 DIAGNOSIS — I252 Old myocardial infarction: Secondary | ICD-10-CM | POA: Diagnosis not present

## 2020-08-11 DIAGNOSIS — Z955 Presence of coronary angioplasty implant and graft: Secondary | ICD-10-CM | POA: Diagnosis not present

## 2020-08-14 DIAGNOSIS — I252 Old myocardial infarction: Secondary | ICD-10-CM | POA: Diagnosis not present

## 2020-08-14 DIAGNOSIS — Z955 Presence of coronary angioplasty implant and graft: Secondary | ICD-10-CM | POA: Diagnosis not present

## 2020-08-16 DIAGNOSIS — Z955 Presence of coronary angioplasty implant and graft: Secondary | ICD-10-CM | POA: Diagnosis not present

## 2020-08-16 DIAGNOSIS — I252 Old myocardial infarction: Secondary | ICD-10-CM | POA: Diagnosis not present

## 2020-08-18 DIAGNOSIS — I252 Old myocardial infarction: Secondary | ICD-10-CM | POA: Diagnosis not present

## 2020-08-18 DIAGNOSIS — Z955 Presence of coronary angioplasty implant and graft: Secondary | ICD-10-CM | POA: Diagnosis not present

## 2020-08-21 ENCOUNTER — Other Ambulatory Visit: Payer: Self-pay

## 2020-08-21 DIAGNOSIS — Z955 Presence of coronary angioplasty implant and graft: Secondary | ICD-10-CM | POA: Diagnosis not present

## 2020-08-21 DIAGNOSIS — I252 Old myocardial infarction: Secondary | ICD-10-CM | POA: Diagnosis not present

## 2020-08-22 ENCOUNTER — Encounter: Payer: Self-pay | Admitting: Cardiology

## 2020-08-22 ENCOUNTER — Ambulatory Visit: Payer: Medicare HMO | Admitting: Cardiology

## 2020-08-22 ENCOUNTER — Other Ambulatory Visit: Payer: Self-pay

## 2020-08-22 VITALS — BP 134/74 | HR 72 | Ht 69.0 in | Wt 160.8 lb

## 2020-08-22 DIAGNOSIS — E1122 Type 2 diabetes mellitus with diabetic chronic kidney disease: Secondary | ICD-10-CM | POA: Diagnosis not present

## 2020-08-22 DIAGNOSIS — E119 Type 2 diabetes mellitus without complications: Secondary | ICD-10-CM

## 2020-08-22 DIAGNOSIS — I2510 Atherosclerotic heart disease of native coronary artery without angina pectoris: Secondary | ICD-10-CM

## 2020-08-22 DIAGNOSIS — I10 Essential (primary) hypertension: Secondary | ICD-10-CM

## 2020-08-22 DIAGNOSIS — N183 Chronic kidney disease, stage 3 unspecified: Secondary | ICD-10-CM

## 2020-08-22 NOTE — Progress Notes (Signed)
Cardiology Office Note:    Date:  08/22/2020   ID:  Wayne Marshall, DOB Oct 27, 1950, MRN [id number]  PCP:  Abner Greenspan, MD  Cardiologist:  Garwin Brothers, MD   Referring MD: Abner Greenspan, MD    ASSESSMENT:    1. Coronary artery disease involving native coronary artery of native heart without angina pectoris   2. Primary hypertension   3. Diabetes mellitus without complication (HCC)   4. CKD stage 3 secondary to diabetes [facility])    PLAN:    In order of problems listed above:  1. Coronary artery disease: Coronary angiography report discussed with the patient at length.  Secondary prevention stressed.  Importance of compliance with diet medication stressed and he vocalized understanding.  He explained to me his exercise protocol and is happy about it.  Patient is on dual antiplatelet therapy. 2. Essential hypertension: Blood pressure stable and diet was emphasized. 3. Mixed dyslipidemia: Lipids were reviewed.  Told him to come back with his next appointment in 3 months with a repeat check.  At that time we might consider reducing his statin dose. 4. Diabetes mellitus and renal insufficiency: Managed by primary care.  Caution advised about diet.  He was told not to use over-the-counter medications without talking to his primary care. 5. Patient will be seen in follow-up appointment in 6 months or earlier if the patient has any concerns    Medication Adjustments/Labs and Tests Ordered: Current medicines are reviewed at length with the patient today.  Concerns regarding medicines are outlined above.  No orders of the defined types were placed in this encounter.  No orders of the defined types were placed in this encounter.    No chief complaint on file.    History of Present Illness:    Wayne Marshall is a 70 y.o. male.  Patient has past medical history of coronary artery disease, essential hypertension, dyslipidemia diabetes mellitus and renal insufficiency.  He denies any  problems at this time and takes care of activities of daily living.  No chest pain orthopnea or PND.  At the time of my evaluation, the patient is alert awake oriented and in no distress.  He is enrolled in cardiac rehab program and exercises well on a regular basis.  Past Medical History:  Diagnosis Date  . Acute respiratory failure with hypoxia (HCC) 06/30/2019  . AKI (acute kidney injury) (HCC) 06/30/2019  . CAD (coronary artery disease) 06/29/2020  . Chronic kidney disease   . CKD stage 3 secondary to diabetes (HCC) 06/30/2019  . COVID-19 2021  . Diabetes mellitus without complication (HCC)   . DM2 (diabetes mellitus, type 2) (HCC) 06/30/2019  . HTN (hypertension) 06/30/2019  . Hypertension   . Hypoxemia   . Impingement syndrome of left shoulder region 11/12/2017  . Pneumonia due to COVID-19 virus 06/30/2019  . STEMI (ST elevation myocardial infarction) (HCC) 06/19/2020  . 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 Stroke [facility])     Past Surgical History:  Procedure Laterality Date  . CORONARY STENT INTERVENTION N/A 06/19/2020   Procedure: CORONARY STENT INTERVENTION;  Surgeon: Yvonne Kendall, MD;  [facility]: [facility];  Service: Cardiovascular;  Laterality: N/A;  . CORONARY STENT INTERVENTION N/A 06/21/2020   Procedure: CORONARY STENT INTERVENTION;  Surgeon: Iran Ouch, MD;  Location: [facility] LAB;  Service: Cardiovascular;  Laterality: N/A;  . INTRAVASCULAR ULTRASOUND/IVUS N/A 06/21/2020   Procedure: Intravascular Ultrasound/IVUS;  Surgeon: Iran Ouch, MD;  Location:  [facility];  Service: Cardiovascular;  Laterality: N/A;  . LEFT HEART CATH AND CORONARY ANGIOGRAPHY N/A 06/19/2020   Procedure: LEFT HEART CATH AND CORONARY ANGIOGRAPHY;  Surgeon: Yvonne Kendall, MD;  Location: [facility];  Service: Cardiovascular;  Laterality: N/A;    Current Medications: Current Meds  Medication Sig  . allopurinol (ZYLOPRIM) 100 MG tablet Take 100 mg  by mouth daily.  Marland Kitchen aspirin 81 MG chewable tablet Chew 1 tablet (81 mg total) by mouth daily.  Marland Kitchen atorvastatin (LIPITOR) 80 MG tablet Take 1 tablet (80 mg total) by mouth daily.  . carvedilol (COREG) 6.25 MG tablet Take 6.25 mg by mouth 2 (two) times daily with a meal.  . guaiFENesin-dextromethorphan (ROBITUSSIN DM) 100-10 MG/5ML syrup Take 10 mLs by mouth every 4 (four) hours as needed for cough.  . Ipratropium-Albuterol (COMBIVENT) 20-100 MCG/ACT AERS respimat Inhale 1 puff into the lungs every 6 (six) hours as needed for wheezing or shortness of breath.  Marland Kitchen JARDIANCE 25 MG TABS tablet Take 25 mg by mouth daily.  . metFORMIN (GLUCOPHAGE) 1000 MG tablet Take 1,000 mg by mouth 2 (two) times daily with a meal.  . Multiple Vitamin (MULTIVITAMIN WITH MINERALS) TABS tablet Take 1 tablet by mouth daily.  . nitroGLYCERIN (NITROSTAT) 0.4 MG SL tablet Place 0.4 mg under the tongue every 5 (five) minutes as needed for chest pain.  Marland Kitchen Psyllium (METAMUCIL FIBER PO) Take 1 tablet by mouth at bedtime.  . ticagrelor (BRILINTA) 90 MG TABS tablet Take 1 tablet (90 mg total) by mouth 2 (two) times daily.  . valsartan-hydrochlorothiazide (DIOVAN-HCT) 320-12.5 MG tablet Take 1 tablet by mouth daily.     Allergies:   Patient has no known allergies.   Social History   Socioeconomic History  . Marital status: Married    Spouse name: margie Goodroe  . Number of children: Not on file  . Years of education: Not on file  . Highest education level: Not on file  Occupational History  . Not on file  Tobacco Use  . Smoking status: Never Smoker  . Smokeless tobacco: Never Used  Vaping Use  . Vaping Use: Never used  Substance and Sexual Activity  . Alcohol use: Never  . Drug use: Never  . Sexual activity: Never  Other Topics Concern  . Not on file  Social History Narrative  . Not on file   Social Determinants of Health   Financial Resource Strain: Not on file  Food Insecurity: Not on file  Transportation  Needs: Not on file  Physical Activity: Not on file  Stress: Not on file  Social Connections: Not on file     Family History: The patient's family history includes Cancer in his father. There is no history of Heart disease.  ROS:   Please see the history of present illness.    All other systems reviewed and are negative.  EKGs/Labs/Other Studies Reviewed:    The following studies were reviewed today: CORONARY STENT INTERVENTION  Intravascular Ultrasound/IVUS    Conclusion    1st Diag lesion is 50% stenosed.  Prox LAD lesion is 80% stenosed.  1st Mrg lesion is 90% stenosed.  Mid Cx lesion is 90% stenosed.  Ost Cx to Prox Cx lesion is 70% stenosed.  Mid LAD lesion is 50% stenosed.  A drug-eluting stent was successfully placed using a SYNERGY XD [id number].  Post intervention, there is a 0% residual stenosis.  Post intervention, there is a 0% residual stenosis.   Successful  OCT-guided angioplasty and drug-eluting stent placement to the proximal LAD.  Recommendations: Initial plans were to possibly intervene on the left circumflex but angiography showed diffuse disease extending all the way back to the ostium with relatively small vessel diameter.  I felt that medical therapy is probably best and reserving PCI for refractory anginal symptoms.  The patient is already on dual antiplatelet therapy. He can be discharged home with no other issues.     Recent Labs: 06/19/2020: TSH 1.989 06/20/2020: Hemoglobin 13.2; Platelets 284 07/05/2020: ALT 19 07/17/2020: BUN 23; Creatinine, Ser 1.35; Potassium 4.0; Sodium 141  Recent Lipid Panel    Component Value Date/Time   CHOL 82 (L) 07/05/2020 0842   TRIG 96 07/05/2020 0842   HDL 25 (L) 07/05/2020 0842   CHOLHDL 3.3 07/05/2020 0842   CHOLHDL 3.4 06/19/2020 1630   VLDL 22 06/19/2020 1630   LDLCALC 38 07/05/2020 0842    Physical Exam:    VS:  BP 134/74   Pulse 72   Ht 5\' 9"  (1.753 m)   Wt 160 lb 12.8 oz (72.9 kg)    SpO2 98%   BMI 23.75 kg/m     Wt Readings from Last 3 Encounters:  08/22/20 160 lb 12.8 oz (72.9 kg)  06/29/20 172 lb 6.4 oz (78.2 kg)  06/21/20 172 lb 1.6 oz (78.1 kg)     GEN: Patient is in no acute distress HEENT: Normal NECK: No JVD; No carotid bruits LYMPHATICS: No lymphadenopathy CARDIAC: Hear sounds regular, 2/6 systolic murmur at the apex. RESPIRATORY:  Clear to auscultation without rales, wheezing or rhonchi  ABDOMEN: Soft, non-tender, non-distended MUSCULOSKELETAL:  No edema; No deformity  SKIN: Warm and dry NEUROLOGIC:  Alert and oriented x 3 PSYCHIATRIC:  Normal affect   Signed, 08/19/20, MD  08/22/2020 8:12 AM    [facility]

## 2020-08-22 NOTE — Patient Instructions (Signed)
Medication Instructions:  No medication changes. *If you need a refill on your cardiac medications before your next appointment, please call your pharmacy*   Lab Work: Your physician recommends that you return for lab work before your next visit. You need to have labs done when you are fasting.  You can come Monday through Friday 8:30 am to 12:00 pm and 1:15 to 4:30. You do not need to make an appointment as the order has already been placed. The labs you are going to have done are BMET, TSH, LFT and Lipids. If you have labs (blood work) drawn today and your tests are completely normal, you will receive your results only by: Marland Kitchen MyChart Message (if you have MyChart) OR . A paper copy in the mail If you have any lab test that is abnormal or we need to change your treatment, we will call you to review the results.   Testing/Procedures: None ordered   Follow-Up: At [facility], you and your health needs are our priority.  As part of our continuing mission to provide you with exceptional heart care, we have created designated Provider Care Teams.  These Care Teams include your primary Cardiologist (physician) and Advanced Practice Providers (APPs -  Physician Assistants and Nurse Practitioners) who all work together to provide you with the care you need, when you need it.  We recommend signing up for the patient portal called "MyChart".  Sign up information is provided on this After Visit Summary.  MyChart is used to connect with patients for Virtual Visits (Telemedicine).  Patients are able to view lab/test results, encounter notes, upcoming appointments, etc.  Non-urgent messages can be sent to your provider as well.   To learn more about what you can do with MyChart, go to [email or web address].    Your next appointment:   3 day(s)  The format for your next appointment:   In Person  Provider:   Belva Crome, MD   Other Instructions NA

## 2020-08-23 DIAGNOSIS — I252 Old myocardial infarction: Secondary | ICD-10-CM | POA: Diagnosis not present

## 2020-08-23 DIAGNOSIS — Z955 Presence of coronary angioplasty implant and graft: Secondary | ICD-10-CM | POA: Diagnosis not present

## 2020-08-24 DIAGNOSIS — Z955 Presence of coronary angioplasty implant and graft: Secondary | ICD-10-CM | POA: Diagnosis not present

## 2020-08-24 DIAGNOSIS — I252 Old myocardial infarction: Secondary | ICD-10-CM | POA: Diagnosis not present

## 2020-08-28 DIAGNOSIS — Z955 Presence of coronary angioplasty implant and graft: Secondary | ICD-10-CM | POA: Diagnosis not present

## 2020-08-28 DIAGNOSIS — I252 Old myocardial infarction: Secondary | ICD-10-CM | POA: Diagnosis not present

## 2020-08-30 DIAGNOSIS — Z955 Presence of coronary angioplasty implant and graft: Secondary | ICD-10-CM | POA: Diagnosis not present

## 2020-08-30 DIAGNOSIS — I252 Old myocardial infarction: Secondary | ICD-10-CM | POA: Diagnosis not present

## 2020-09-01 DIAGNOSIS — Z955 Presence of coronary angioplasty implant and graft: Secondary | ICD-10-CM | POA: Diagnosis not present

## 2020-09-01 DIAGNOSIS — I252 Old myocardial infarction: Secondary | ICD-10-CM | POA: Diagnosis not present

## 2020-09-04 DIAGNOSIS — Z955 Presence of coronary angioplasty implant and graft: Secondary | ICD-10-CM | POA: Diagnosis not present

## 2020-09-04 DIAGNOSIS — I252 Old myocardial infarction: Secondary | ICD-10-CM | POA: Diagnosis not present

## 2020-09-06 ENCOUNTER — Ambulatory Visit: Payer: Medicare HMO | Admitting: Cardiology

## 2020-09-06 DIAGNOSIS — Z955 Presence of coronary angioplasty implant and graft: Secondary | ICD-10-CM | POA: Diagnosis not present

## 2020-09-06 DIAGNOSIS — I252 Old myocardial infarction: Secondary | ICD-10-CM | POA: Diagnosis not present

## 2020-09-08 DIAGNOSIS — Z955 Presence of coronary angioplasty implant and graft: Secondary | ICD-10-CM | POA: Diagnosis not present

## 2020-09-08 DIAGNOSIS — I252 Old myocardial infarction: Secondary | ICD-10-CM | POA: Diagnosis not present

## 2020-09-11 DIAGNOSIS — Z955 Presence of coronary angioplasty implant and graft: Secondary | ICD-10-CM | POA: Diagnosis not present

## 2020-09-11 DIAGNOSIS — I252 Old myocardial infarction: Secondary | ICD-10-CM | POA: Diagnosis not present

## 2020-09-12 DIAGNOSIS — E1169 Type 2 diabetes mellitus with other specified complication: Secondary | ICD-10-CM | POA: Diagnosis not present

## 2020-09-13 DIAGNOSIS — I252 Old myocardial infarction: Secondary | ICD-10-CM | POA: Diagnosis not present

## 2020-09-13 DIAGNOSIS — Z955 Presence of coronary angioplasty implant and graft: Secondary | ICD-10-CM | POA: Diagnosis not present

## 2020-09-15 DIAGNOSIS — I252 Old myocardial infarction: Secondary | ICD-10-CM | POA: Diagnosis not present

## 2020-09-15 DIAGNOSIS — Z955 Presence of coronary angioplasty implant and graft: Secondary | ICD-10-CM | POA: Diagnosis not present

## 2020-09-18 DIAGNOSIS — Z955 Presence of coronary angioplasty implant and graft: Secondary | ICD-10-CM | POA: Diagnosis not present

## 2020-09-18 DIAGNOSIS — I252 Old myocardial infarction: Secondary | ICD-10-CM | POA: Diagnosis not present

## 2020-09-20 DIAGNOSIS — I252 Old myocardial infarction: Secondary | ICD-10-CM | POA: Diagnosis not present

## 2020-09-20 DIAGNOSIS — Z955 Presence of coronary angioplasty implant and graft: Secondary | ICD-10-CM | POA: Diagnosis not present

## 2020-09-22 DIAGNOSIS — Z955 Presence of coronary angioplasty implant and graft: Secondary | ICD-10-CM | POA: Diagnosis not present

## 2020-09-22 DIAGNOSIS — I252 Old myocardial infarction: Secondary | ICD-10-CM | POA: Diagnosis not present

## 2020-09-25 DIAGNOSIS — I252 Old myocardial infarction: Secondary | ICD-10-CM | POA: Diagnosis not present

## 2020-09-25 DIAGNOSIS — I2510 Atherosclerotic heart disease of native coronary artery without angina pectoris: Secondary | ICD-10-CM | POA: Diagnosis not present

## 2020-09-25 DIAGNOSIS — Z139 Encounter for screening, unspecified: Secondary | ICD-10-CM | POA: Diagnosis not present

## 2020-09-25 DIAGNOSIS — Z7189 Other specified counseling: Secondary | ICD-10-CM | POA: Diagnosis not present

## 2020-09-25 DIAGNOSIS — E1122 Type 2 diabetes mellitus with diabetic chronic kidney disease: Secondary | ICD-10-CM | POA: Diagnosis not present

## 2020-09-25 DIAGNOSIS — N1831 Chronic kidney disease, stage 3a: Secondary | ICD-10-CM | POA: Diagnosis not present

## 2020-09-25 DIAGNOSIS — Z1339 Encounter for screening examination for other mental health and behavioral disorders: Secondary | ICD-10-CM | POA: Diagnosis not present

## 2020-09-25 DIAGNOSIS — Z1331 Encounter for screening for depression: Secondary | ICD-10-CM | POA: Diagnosis not present

## 2020-09-25 DIAGNOSIS — Z0000 Encounter for general adult medical examination without abnormal findings: Secondary | ICD-10-CM | POA: Diagnosis not present

## 2020-09-25 DIAGNOSIS — E785 Hyperlipidemia, unspecified: Secondary | ICD-10-CM | POA: Diagnosis not present

## 2020-09-25 DIAGNOSIS — Z955 Presence of coronary angioplasty implant and graft: Secondary | ICD-10-CM | POA: Diagnosis not present

## 2020-09-27 DIAGNOSIS — Z955 Presence of coronary angioplasty implant and graft: Secondary | ICD-10-CM | POA: Diagnosis not present

## 2020-09-27 DIAGNOSIS — I252 Old myocardial infarction: Secondary | ICD-10-CM | POA: Diagnosis not present

## 2020-10-02 DIAGNOSIS — I252 Old myocardial infarction: Secondary | ICD-10-CM | POA: Diagnosis not present

## 2020-10-02 DIAGNOSIS — Z955 Presence of coronary angioplasty implant and graft: Secondary | ICD-10-CM | POA: Diagnosis not present

## 2020-10-04 DIAGNOSIS — I252 Old myocardial infarction: Secondary | ICD-10-CM | POA: Diagnosis not present

## 2020-10-04 DIAGNOSIS — Z955 Presence of coronary angioplasty implant and graft: Secondary | ICD-10-CM | POA: Diagnosis not present

## 2020-10-10 DIAGNOSIS — I252 Old myocardial infarction: Secondary | ICD-10-CM | POA: Diagnosis not present

## 2020-10-10 DIAGNOSIS — Z955 Presence of coronary angioplasty implant and graft: Secondary | ICD-10-CM | POA: Diagnosis not present

## 2020-10-11 DIAGNOSIS — I252 Old myocardial infarction: Secondary | ICD-10-CM | POA: Diagnosis not present

## 2020-10-11 DIAGNOSIS — Z955 Presence of coronary angioplasty implant and graft: Secondary | ICD-10-CM | POA: Diagnosis not present

## 2020-11-30 DIAGNOSIS — I2510 Atherosclerotic heart disease of native coronary artery without angina pectoris: Secondary | ICD-10-CM | POA: Diagnosis not present

## 2020-11-30 DIAGNOSIS — N183 Chronic kidney disease, stage 3 unspecified: Secondary | ICD-10-CM | POA: Diagnosis not present

## 2020-11-30 DIAGNOSIS — E1122 Type 2 diabetes mellitus with diabetic chronic kidney disease: Secondary | ICD-10-CM | POA: Diagnosis not present

## 2020-11-30 DIAGNOSIS — E119 Type 2 diabetes mellitus without complications: Secondary | ICD-10-CM | POA: Diagnosis not present

## 2020-11-30 DIAGNOSIS — I10 Essential (primary) hypertension: Secondary | ICD-10-CM | POA: Diagnosis not present

## 2020-11-30 LAB — BASIC METABOLIC PANEL
BUN/Creatinine Ratio: 16 (ref 10–24)
BUN: 21 mg/dL (ref 8–27)
CO2: 25 mmol/L (ref 20–29)
Calcium: 10.2 mg/dL (ref 8.6–10.2)
Chloride: 98 mmol/L (ref 96–106)
Creatinine, Ser: 1.31 mg/dL — ABNORMAL HIGH (ref 0.76–1.27)
Glucose: 93 mg/dL (ref 65–99)
Potassium: 3.8 mmol/L (ref 3.5–5.2)
Sodium: 136 mmol/L (ref 134–144)
eGFR: 58 mL/min/{1.73_m2} — ABNORMAL LOW (ref >59)

## 2020-11-30 LAB — HEPATIC FUNCTION PANEL
ALT: 15 IU/L (ref 0–44)
AST: 17 IU/L (ref 0–40)
Albumin: 4.3 g/dL (ref 3.7–4.7)
Alkaline Phosphatase: 92 IU/L (ref 44–121)
Bilirubin Total: 0.7 mg/dL (ref 0.0–1.2)
Bilirubin, Direct: 0.21 mg/dL (ref 0.00–0.40)
Total Protein: 6.9 g/dL (ref 6.0–8.5)

## 2020-11-30 LAB — TSH: TSH: 1.76 u[IU]/mL (ref 0.450–4.500)

## 2020-11-30 LAB — LIPID PANEL
Chol/HDL Ratio: 3.2 ratio (ref 0.0–5.0)
Cholesterol, Total: 145 mg/dL (ref 100–199)
HDL: 45 mg/dL (ref >39)
LDL Chol Calc (NIH): 88 mg/dL (ref 0–99)
Triglycerides: 60 mg/dL (ref 0–149)
VLDL Cholesterol Cal: 12 mg/dL (ref 5–40)

## 2020-12-01 ENCOUNTER — Encounter: Payer: Self-pay | Admitting: Cardiology

## 2020-12-01 ENCOUNTER — Ambulatory Visit: Payer: Medicare HMO | Admitting: Cardiology

## 2020-12-01 ENCOUNTER — Other Ambulatory Visit: Payer: Self-pay

## 2020-12-01 VITALS — BP 128/70 | HR 70 | Ht 69.0 in | Wt 157.8 lb

## 2020-12-01 DIAGNOSIS — E782 Mixed hyperlipidemia: Secondary | ICD-10-CM

## 2020-12-01 DIAGNOSIS — N183 Chronic kidney disease, stage 3 unspecified: Secondary | ICD-10-CM | POA: Diagnosis not present

## 2020-12-01 DIAGNOSIS — I2510 Atherosclerotic heart disease of native coronary artery without angina pectoris: Secondary | ICD-10-CM | POA: Diagnosis not present

## 2020-12-01 DIAGNOSIS — E119 Type 2 diabetes mellitus without complications: Secondary | ICD-10-CM | POA: Diagnosis not present

## 2020-12-01 DIAGNOSIS — E1122 Type 2 diabetes mellitus with diabetic chronic kidney disease: Secondary | ICD-10-CM | POA: Diagnosis not present

## 2020-12-01 HISTORY — DX: Mixed hyperlipidemia: E78.2

## 2020-12-01 NOTE — Patient Instructions (Signed)
Medication Instructions:  No medication changes. *If you need a refill on your cardiac medications before your next appointment, please call your pharmacy*   Lab Work: Your physician recommends that you return for lab work in: before your next visit. You need to have labs done when you are fasting.  You can come Monday through Friday 8:30 am to 12:00 pm and 1:15 to 4:30. You do not need to make an appointment as the order has already been placed. The labs you are going to have done are BMET, LFT and Lipids.  If you have labs (blood work) drawn today and your tests are completely normal, you will receive your results only by: MyChart Message (if you have MyChart) OR A paper copy in the mail If you have any lab test that is abnormal or we need to change your treatment, we will call you to review the results.   Testing/Procedures: None ordered   Follow-Up: At [facility], you and your health needs are our priority.  As part of our continuing mission to provide you with exceptional heart care, we have created designated Provider Care Teams.  These Care Teams include your primary Cardiologist (physician) and Advanced Practice Providers (APPs -  Physician Assistants and Nurse Practitioners) who all work together to provide you with the care you need, when you need it.  We recommend signing up for the patient portal called "MyChart".  Sign up information is provided on this After Visit Summary.  MyChart is used to connect with patients for Virtual Visits (Telemedicine).  Patients are able to view lab/test results, encounter notes, upcoming appointments, etc.  Non-urgent messages can be sent to your provider as well.   To learn more about what you can do with MyChart, go to [email or web address].    Your next appointment:   6 month(s)  The format for your next appointment:   In Person  Provider:   Rajan Revankar, MD   Other Instructions NA  

## 2020-12-01 NOTE — Progress Notes (Signed)
Cardiology Office Note:    Date:  12/01/2020   ID:  Wayne Marshall August 28, 1950, MRN [id number]  PCP:  Abner Greenspan, MD  Cardiologist:  Garwin Brothers, MD   Referring MD: Abner Greenspan, MD    ASSESSMENT:    1. Coronary artery disease involving native coronary artery of native heart without angina pectoris   2. CKD stage 3 secondary to diabetes (HCC)   3. Diabetes mellitus without complication (HCC)   4. Mixed dyslipidemia    PLAN:    In order of problems listed above:  Coronary artery disease: Secondary prevention stressed with the patient.  Importance of compliance with diet medication stressed and he vocalized understanding.  He was advised to walk at least half an hour a day 5 days a week and he promises to do so. Hypertension: Blood pressure stable and diet was emphasized. Mixed dyslipidemia: Lipids were reviewed.  I told him to be careful with diet and exercise better.  His LDL has gone up. Diabetes mellitus and renal insufficiency: I discussed this with him at extensive length.  This is followed by primary care.  Questions were answered to satisfaction. Patient will be seen in follow-up appointment in 6 months or earlier if the patient has any concerns    Medication Adjustments/Labs and Tests Ordered: Current medicines are reviewed at length with the patient today.  Concerns regarding medicines are outlined above.  Orders Placed This Encounter  Procedures   Basic metabolic panel   Hepatic function panel   Lipid panel   No orders of the defined types were placed in this encounter.    No chief complaint on file.    History of Present Illness:    Wayne Marshall is a 70 y.o. male.  Patient has past medical history of coronary artery disease, essential hypertension, dyslipidemia and diabetes mellitus.  He has renal insufficiency.  He denies any problems at this time and takes care of activities of daily living.  No chest pain orthopnea or PND.  At the time of  my evaluation, the patient is alert awake oriented and in no distress.  Past Medical History:  Diagnosis Date   Acute respiratory failure with hypoxia (HCC) 06/30/2019   AKI (acute kidney injury) (HCC) 06/30/2019   CAD (coronary artery disease) 06/29/2020   Chronic kidney disease    CKD stage 3 secondary to diabetes (HCC) 06/30/2019   COVID-19 2021   Diabetes mellitus without complication (HCC)    DM2 (diabetes mellitus, type 2) (HCC) 06/30/2019   HTN (hypertension) 06/30/2019   Hypertension    Hypoxemia    Impingement syndrome of left shoulder region 11/12/2017   Pneumonia due to COVID-19 virus 06/30/2019   STEMI (ST elevation myocardial infarction) (HCC) 06/19/2020   STEMI involving right coronary artery (HCC) 06/19/2020   Stroke [facility])     Past Surgical History:  Procedure Laterality Date   CORONARY STENT INTERVENTION N/A 06/19/2020   Procedure: CORONARY STENT INTERVENTION;  Surgeon: Yvonne Kendall, MD;  Location: [facility];  Service: Cardiovascular;  Laterality: N/A;   CORONARY STENT INTERVENTION N/A 06/21/2020   Procedure: CORONARY STENT INTERVENTION;  Surgeon: Iran Ouch, MD;  Location: [facility];  Service: Cardiovascular;  Laterality: N/A;   INTRAVASCULAR ULTRASOUND/IVUS N/A 06/21/2020   Procedure: Intravascular Ultrasound/IVUS;  Surgeon: Iran Ouch, MD;  Location: [facility] CV LAB;  Service: Cardiovascular;  Laterality: N/A;   LEFT HEART CATH AND CORONARY ANGIOGRAPHY N/A 06/19/2020   Procedure: LEFT HEART  CATH AND CORONARY ANGIOGRAPHY;  Surgeon: Yvonne Kendall, MD;  Location: [facility];  Service: Cardiovascular;  Laterality: N/A;    Current Medications: Current Meds  Medication Sig   allopurinol (ZYLOPRIM) 100 MG tablet Take 100 mg by mouth daily.   aspirin 81 MG chewable tablet Chew 1 tablet (81 mg total) by mouth daily.   atorvastatin (LIPITOR) 80 MG tablet Take 1 tablet (80 mg total) by mouth daily.   carvedilol (COREG) 6.25 MG tablet Take  6.25 mg by mouth 2 (two) times daily with a meal.   guaiFENesin-dextromethorphan (ROBITUSSIN DM) 100-10 MG/5ML syrup Take 10 mLs by mouth every 4 (four) hours as needed for cough.   Ipratropium-Albuterol (COMBIVENT) 20-100 MCG/ACT AERS respimat Inhale 1 puff into the lungs every 6 (six) hours as needed for wheezing or shortness of breath.   JARDIANCE 25 MG TABS tablet Take 25 mg by mouth daily.   metFORMIN (GLUCOPHAGE) 1000 MG tablet Take 1,000 mg by mouth 2 (two) times daily with a meal.   Multiple Vitamin (MULTIVITAMIN WITH MINERALS) TABS tablet Take 1 tablet by mouth daily.   nitroGLYCERIN (NITROSTAT) 0.4 MG SL tablet Place 0.4 mg under the tongue every 5 (five) minutes as needed for chest pain.   Psyllium (METAMUCIL FIBER PO) Take 1 tablet by mouth at bedtime.   ticagrelor (BRILINTA) 90 MG TABS tablet Take 1 tablet (90 mg total) by mouth 2 (two) times daily.   valsartan-hydrochlorothiazide (DIOVAN-HCT) 320-12.5 MG tablet Take 1 tablet by mouth daily.     Allergies:   Patient has no known allergies.   Social History   Socioeconomic History   Marital status: Married    Spouse name: margie Mandley   Number of children: Not on file   Years of education: Not on file   Highest education level: Not on file  Occupational History   Not on file  Tobacco Use   Smoking status: Never   Smokeless tobacco: Never  Vaping Use   Vaping Use: Never used  Substance and Sexual Activity   Alcohol use: Never   Drug use: Never   Sexual activity: Never  Other Topics Concern   Not on file  Social History Narrative   Not on file   Social Determinants of Health   Financial Resource Strain: Not on file  Food Insecurity: Not on file  Transportation Needs: Not on file  Physical Activity: Not on file  Stress: Not on file  Social Connections: Not on file     Family History: The patient's family history includes Cancer in his father. There is no history of Heart disease.  ROS:   Please see the  history of present illness.    All other systems reviewed and are negative.  EKGs/Labs/Other Studies Reviewed:    The following studies were reviewed today: I discussed my findings with the patient at length   Recent Labs: 06/20/2020: Hemoglobin 13.2; Platelets 284 11/30/2020: ALT 15; BUN 21; Creatinine, Ser 1.31; Potassium 3.8; Sodium 136; TSH 1.760  Recent Lipid Panel    Component Value Date/Time   CHOL 145 11/30/2020 0910   TRIG 60 11/30/2020 0910   HDL 45 11/30/2020 0910   CHOLHDL 3.2 11/30/2020 0910   CHOLHDL 3.4 06/19/2020 1630   VLDL 22 06/19/2020 1630   LDLCALC 88 11/30/2020 0910    Physical Exam:    VS:  BP 128/70   Pulse 70   Ht 5\' 9"  (1.753 m)   Wt 157 lb 12.8 oz (71.6 kg)  SpO2 98%   BMI 23.30 kg/m     Wt Readings from Last 3 Encounters:  12/01/20 157 lb 12.8 oz (71.6 kg)  08/22/20 160 lb 12.8 oz (72.9 kg)  06/29/20 172 lb 6.4 oz (78.2 kg)     GEN: Patient is in no acute distress HEENT: Normal NECK: No JVD; No carotid bruits LYMPHATICS: No lymphadenopathy CARDIAC: Hear sounds regular, 2/6 systolic murmur at the apex. RESPIRATORY:  Clear to auscultation without rales, wheezing or rhonchi  ABDOMEN: Soft, non-tender, non-distended MUSCULOSKELETAL:  No edema; No deformity  SKIN: Warm and dry NEUROLOGIC:  Alert and oriented x 3 PSYCHIATRIC:  Normal affect   Signed, Garwin Brothers, MD  12/01/2020 9:55 AM    [facility]

## 2021-01-31 DIAGNOSIS — E1169 Type 2 diabetes mellitus with other specified complication: Secondary | ICD-10-CM | POA: Diagnosis not present

## 2021-01-31 DIAGNOSIS — Z125 Encounter for screening for malignant neoplasm of prostate: Secondary | ICD-10-CM | POA: Diagnosis not present

## 2021-02-07 ENCOUNTER — Telehealth: Payer: Self-pay | Admitting: Cardiology

## 2021-02-07 DIAGNOSIS — I10 Essential (primary) hypertension: Secondary | ICD-10-CM | POA: Diagnosis not present

## 2021-02-07 DIAGNOSIS — E1122 Type 2 diabetes mellitus with diabetic chronic kidney disease: Secondary | ICD-10-CM | POA: Diagnosis not present

## 2021-02-07 DIAGNOSIS — N1831 Chronic kidney disease, stage 3a: Secondary | ICD-10-CM | POA: Diagnosis not present

## 2021-02-07 DIAGNOSIS — E785 Hyperlipidemia, unspecified: Secondary | ICD-10-CM | POA: Diagnosis not present

## 2021-02-07 MED ORDER — TICAGRELOR 90 MG PO TABS: 90.0000 mg | ORAL_TABLET | Freq: Two times a day (BID) | ORAL | 2 refills | Status: DC

## 2021-02-07 NOTE — Telephone Encounter (Signed)
Patient needs refill for Brilinta sent to [facility] on [address]

## 2021-03-07 DIAGNOSIS — Z6824 Body mass index (BMI) 24.0-24.9, adult: Secondary | ICD-10-CM | POA: Diagnosis not present

## 2021-03-07 DIAGNOSIS — R011 Cardiac murmur, unspecified: Secondary | ICD-10-CM | POA: Diagnosis not present

## 2021-03-07 DIAGNOSIS — R42 Dizziness and giddiness: Secondary | ICD-10-CM | POA: Diagnosis not present

## 2021-03-13 DIAGNOSIS — R011 Cardiac murmur, unspecified: Secondary | ICD-10-CM | POA: Diagnosis not present

## 2021-03-26 DIAGNOSIS — Z6824 Body mass index (BMI) 24.0-24.9, adult: Secondary | ICD-10-CM | POA: Diagnosis not present

## 2021-03-26 DIAGNOSIS — L989 Disorder of the skin and subcutaneous tissue, unspecified: Secondary | ICD-10-CM | POA: Diagnosis not present

## 2021-03-26 DIAGNOSIS — M25519 Pain in unspecified shoulder: Secondary | ICD-10-CM | POA: Diagnosis not present

## 2021-03-26 DIAGNOSIS — L03011 Cellulitis of right finger: Secondary | ICD-10-CM | POA: Diagnosis not present

## 2021-04-18 DIAGNOSIS — Z20822 Contact with and (suspected) exposure to covid-19: Secondary | ICD-10-CM | POA: Diagnosis not present

## 2021-04-18 DIAGNOSIS — R059 Cough, unspecified: Secondary | ICD-10-CM | POA: Diagnosis not present

## 2021-04-18 DIAGNOSIS — R1110 Vomiting, unspecified: Secondary | ICD-10-CM | POA: Diagnosis not present

## 2021-04-18 DIAGNOSIS — J020 Streptococcal pharyngitis: Secondary | ICD-10-CM | POA: Diagnosis not present

## 2021-05-16 DIAGNOSIS — I10 Essential (primary) hypertension: Secondary | ICD-10-CM | POA: Diagnosis not present

## 2021-05-16 DIAGNOSIS — E1169 Type 2 diabetes mellitus with other specified complication: Secondary | ICD-10-CM | POA: Diagnosis not present

## 2021-05-23 DIAGNOSIS — I2510 Atherosclerotic heart disease of native coronary artery without angina pectoris: Secondary | ICD-10-CM | POA: Diagnosis not present

## 2021-05-23 DIAGNOSIS — Z139 Encounter for screening, unspecified: Secondary | ICD-10-CM | POA: Diagnosis not present

## 2021-05-23 DIAGNOSIS — E782 Mixed hyperlipidemia: Secondary | ICD-10-CM | POA: Diagnosis not present

## 2021-05-23 DIAGNOSIS — E785 Hyperlipidemia, unspecified: Secondary | ICD-10-CM | POA: Diagnosis not present

## 2021-05-23 DIAGNOSIS — E1169 Type 2 diabetes mellitus with other specified complication: Secondary | ICD-10-CM | POA: Diagnosis not present

## 2021-05-30 ENCOUNTER — Encounter: Payer: Self-pay | Admitting: Cardiology

## 2021-05-30 ENCOUNTER — Ambulatory Visit: Payer: Medicare HMO | Admitting: Cardiology

## 2021-05-30 ENCOUNTER — Other Ambulatory Visit: Payer: Self-pay

## 2021-05-30 VITALS — BP 140/76 | HR 64 | Ht 69.0 in | Wt 170.6 lb

## 2021-05-30 DIAGNOSIS — E119 Type 2 diabetes mellitus without complications: Secondary | ICD-10-CM | POA: Diagnosis not present

## 2021-05-30 DIAGNOSIS — E1122 Type 2 diabetes mellitus with diabetic chronic kidney disease: Secondary | ICD-10-CM

## 2021-05-30 DIAGNOSIS — N183 Chronic kidney disease, stage 3 unspecified: Secondary | ICD-10-CM | POA: Diagnosis not present

## 2021-05-30 DIAGNOSIS — I2510 Atherosclerotic heart disease of native coronary artery without angina pectoris: Secondary | ICD-10-CM

## 2021-05-30 DIAGNOSIS — E782 Mixed hyperlipidemia: Secondary | ICD-10-CM | POA: Diagnosis not present

## 2021-05-30 NOTE — Patient Instructions (Signed)

## 2021-05-30 NOTE — Progress Notes (Signed)
Cardiology Office Note:    Date:  05/30/2021   ID:  Wayne Marshall, DOB [birth year], MRN [id number]  PCP:  Marco Collie, MD  Cardiologist:  Jenean Lindau, MD   Referring MD: Marco Collie, MD    ASSESSMENT:    1. Coronary artery disease involving native coronary artery of native heart without angina pectoris   2. Mixed dyslipidemia   3. 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 Diabetes mellitus without complication (HCC)    PLAN:    In order of problems listed above:  Coronary artery disease: Secondary prevention stressed to the patient.  Importance of compliance with diet medication stressed any vocalized understanding.  He is doing well with exercise. Essential hypertension: Blood pressure stable and diet was emphasized.  Lifestyle modification urged. Mixed dyslipidemia: Lipids were reviewed.  He had blood work at primary care and we will get a copy and reach out to them if necessary. Renal insufficiency: Advised him about this and precautions especially to talk to primary care before he takes any over-the-counter medications. Patient will be seen in follow-up appointment in 9 months or earlier if the patient has any concerns    Medication Adjustments/Labs and Tests Ordered: Current medicines are reviewed at length with the patient today.  Concerns regarding medicines are outlined above.  No orders of the defined types were placed in this encounter.  No orders of the defined types were placed in this encounter.    No chief complaint on file.    History of Present Illness:    Wayne Marshall is a 71 y.o. male.  Patient has past medical history of coronary artery disease, essential hypertension, mixed dyslipidemia and diabetes mellitus.  He denies any problems at this time and takes care of activities of daily living.  No chest pain orthopnea or PND.  He walks 2 miles a day on a regular basis.  At the time of my evaluation, the patient is alert awake oriented and  in no distress.  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[place]) 06/30/2019   COVID-19 2021   Diabetes mellitus without complication (HCC)    DM2 (diabetes mellitus, type 2) ([place]) 06/30/2019   HTN (hypertension) 06/30/2019   Hypertension    Hypoxemia    Impingement syndrome of left shoulder region 11/12/2017   Mixed dyslipidemia 12/01/2020   Pneumonia due to COVID-19 virus 06/30/2019   STEMI (ST elevation myocardial infarction) ([place]) 06/19/2020   STEMI involving right coronary artery ([place]) 06/19/2020   Stroke [facility])     Past Surgical History:  Procedure Laterality Date   CORONARY STENT INTERVENTION N/A 06/19/2020   Procedure: CORONARY STENT INTERVENTION;  Surgeon: Nelva Bush, MD;  Location: [place] [facility];  Service: Cardiovascular;  Laterality: N/A;   CORONARY STENT INTERVENTION N/A 06/21/2020   Procedure: CORONARY STENT INTERVENTION;  Surgeon: Wellington Hampshire, MD;  Location: [place] [facility];  Service: Cardiovascular;  Laterality: N/A;   INTRAVASCULAR ULTRASOUND/IVUS N/A 06/21/2020   Procedure: Intravascular Ultrasound/IVUS;  Surgeon: Wellington Hampshire, MD;  Location: [place] [facility];  Service: Cardiovascular;  Laterality: N/A;   LEFT HEART CATH AND CORONARY ANGIOGRAPHY N/A 06/19/2020   Procedure: LEFT HEART CATH AND CORONARY ANGIOGRAPHY;  Surgeon: Nelva Bush, MD;  Location: [place]  CV [facility];  Service: Cardiovascular;  Laterality: N/A;    Current Medications: Current Meds  Medication Sig   allopurinol (ZYLOPRIM) 100 MG tablet Take 100 mg by mouth daily.   aspirin 81 MG chewable tablet Chew 1 tablet (81 mg total) by mouth daily.   atorvastatin (LIPITOR) 80 MG tablet Take 1 tablet (80 mg total) by mouth daily.   carvedilol (COREG) 6.25 MG tablet Take 6.25 mg by mouth 2 (two)  times daily with a meal.   guaiFENesin-dextromethorphan (ROBITUSSIN DM) 100-10 MG/5ML syrup Take 10 mLs by mouth every 4 (four) hours as needed for cough.   Ipratropium-Albuterol (COMBIVENT) 20-100 MCG/ACT AERS respimat Inhale 1 puff into the lungs every 6 (six) hours as needed for wheezing or shortness of breath.   metFORMIN (GLUCOPHAGE) 1000 MG tablet Take 1,000 mg by mouth 2 (two) times daily with a meal.   Multiple Vitamin (MULTIVITAMIN WITH MINERALS) TABS tablet Take 1 tablet by mouth daily.   nitroGLYCERIN (NITROSTAT) 0.4 MG SL tablet Place 0.4 mg under the tongue every 5 (five) minutes as needed for chest pain.   Psyllium (METAMUCIL FIBER PO) Take 1 tablet by mouth at bedtime.   ticagrelor (BRILINTA) 90 MG TABS tablet Take 1 tablet (90 mg total) by mouth 2 (two) times daily.   valsartan-hydrochlorothiazide (DIOVAN-HCT) 320-12.5 MG tablet Take 1 tablet by mouth daily.     Allergies:   Patient has no known allergies.   Social History   Socioeconomic History   Marital status: Married    Spouse name: margie Granade   Number of children: Not on file   Years of education: Not on file   Highest education level: Not on file  Occupational History   Not on file  Tobacco Use   Smoking status: Never   Smokeless tobacco: Never  Vaping Use   Vaping Use: Never used  Substance and Sexual Activity   Alcohol use: Never   Drug use: Never   Sexual activity: Never  Other Topics Concern   Not on file  Social History Narrative   Not on file   Social Determinants of Health   Financial Resource Strain: Not on file  Food Insecurity: Not on file  Transportation Needs: Not on file  Physical Activity: Not on file  Stress: Not on file  Social Connections: Not on file     Family History: The patient's family history includes Cancer in his father. There is no history of Heart disease.  ROS:   Please see the history of present illness.    All other systems reviewed and are  negative.  EKGs/Labs/Other Studies Reviewed:    The following studies were reviewed today: EKG reveals sinus rhythm with nonspecific ST-T changes   Recent Labs: 06/20/2020: Hemoglobin 13.2; Platelets 284 11/30/2020: ALT 15; BUN 21; Creatinine, Ser 1.31; Potassium 3.8; Sodium 136; TSH 1.760  Recent Lipid Panel    Component Value Date/Time   CHOL 145 11/30/2020 0910   TRIG 60 11/30/2020 0910   HDL 45 11/30/2020 0910   CHOLHDL 3.2 11/30/2020 0910   CHOLHDL 3.4 06/19/2020 1630   VLDL 22 06/19/2020 1630   LDLCALC 88 11/30/2020 0910    Physical Exam:    VS:  BP 140/76    Pulse 64    Ht 5\' 9"  (1.753 m)    Wt 170 lb 9.6 oz (77.4 kg)    SpO2 99%    BMI 25.19 kg/m     Wt Readings from Last 3 Encounters:  05/30/21 170 lb  9.6 oz (77.4 kg)  12/01/20 157 lb 12.8 oz (71.6 kg)  08/22/20 160 lb 12.8 oz (72.9 kg)     GEN: Patient is in no acute distress HEENT: Normal NECK: No JVD; No carotid bruits LYMPHATICS: No lymphadenopathy CARDIAC: Hear sounds regular, 2/6 systolic murmur at the apex. RESPIRATORY:  Clear to auscultation without rales, wheezing or rhonchi  ABDOMEN: Soft, non-tender, non-distended MUSCULOSKELETAL:  No edema; No deformity  SKIN: Warm and dry NEUROLOGIC:  Alert and oriented x 3 PSYCHIATRIC:  Normal affect   Signed, Jenean Lindau, MD  05/30/2021 10:48 AM    [place]

## 2021-06-20 ENCOUNTER — Other Ambulatory Visit: Payer: Self-pay

## 2021-06-20 NOTE — Progress Notes (Signed)
Dr. Bettina Gavia wanted the patient to stay on his Brilinta until the patient could follow up with Dr. Geraldo Pitter. Attempted to call patient to inform him of Dr. Joya Gaskins recommendation, patient did not answer the phone. Left message for patient to return the call.

## 2021-07-25 DIAGNOSIS — R059 Cough, unspecified: Secondary | ICD-10-CM | POA: Diagnosis not present

## 2021-07-25 DIAGNOSIS — Z20822 Contact with and (suspected) exposure to covid-19: Secondary | ICD-10-CM | POA: Diagnosis not present

## 2021-07-25 DIAGNOSIS — Z6824 Body mass index (BMI) 24.0-24.9, adult: Secondary | ICD-10-CM | POA: Diagnosis not present

## 2021-08-18 DIAGNOSIS — I160 Hypertensive urgency: Secondary | ICD-10-CM | POA: Diagnosis not present

## 2021-08-18 DIAGNOSIS — S63601A Unspecified sprain of right thumb, initial encounter: Secondary | ICD-10-CM | POA: Diagnosis not present

## 2021-08-18 DIAGNOSIS — S6392XA Sprain of unspecified part of left wrist and hand, initial encounter: Secondary | ICD-10-CM | POA: Diagnosis not present

## 2021-08-18 DIAGNOSIS — S8002XA Contusion of left knee, initial encounter: Secondary | ICD-10-CM | POA: Diagnosis not present

## 2021-08-27 DIAGNOSIS — S66901A Unspecified injury of unspecified muscle, fascia and tendon at wrist and hand level, right hand, initial encounter: Secondary | ICD-10-CM | POA: Diagnosis not present

## 2021-08-27 DIAGNOSIS — Z6825 Body mass index (BMI) 25.0-25.9, adult: Secondary | ICD-10-CM | POA: Diagnosis not present

## 2021-08-27 DIAGNOSIS — S6990XA Unspecified injury of unspecified wrist, hand and finger(s), initial encounter: Secondary | ICD-10-CM | POA: Insufficient documentation

## 2021-08-27 DIAGNOSIS — S60011A Contusion of right thumb without damage to nail, initial encounter: Secondary | ICD-10-CM

## 2021-08-27 DIAGNOSIS — S63649A Sprain of metacarpophalangeal joint of unspecified thumb, initial encounter: Secondary | ICD-10-CM | POA: Diagnosis not present

## 2021-08-27 HISTORY — DX: Contusion of right thumb without damage to nail, initial encounter: S60.011A

## 2021-08-27 HISTORY — DX: Unspecified injury of unspecified wrist, hand and finger(s), initial encounter: S69.90XA

## 2021-09-03 DIAGNOSIS — M79641 Pain in right hand: Secondary | ICD-10-CM | POA: Diagnosis not present

## 2021-09-10 DIAGNOSIS — S66901A Unspecified injury of unspecified muscle, fascia and tendon at wrist and hand level, right hand, initial encounter: Secondary | ICD-10-CM | POA: Diagnosis not present

## 2021-09-10 DIAGNOSIS — S60011A Contusion of right thumb without damage to nail, initial encounter: Secondary | ICD-10-CM | POA: Diagnosis not present

## 2021-09-11 DIAGNOSIS — E1169 Type 2 diabetes mellitus with other specified complication: Secondary | ICD-10-CM | POA: Diagnosis not present

## 2021-09-18 ENCOUNTER — Telehealth: Payer: Self-pay

## 2021-09-18 NOTE — Telephone Encounter (Signed)
[name] to review, patient's last PCI was in February 2022.  He is currently on aspirin and Brilinta.  Can the patient hold Brilinta for 5 days prior to the upcoming hand surgery? ? ?Please forward your response to P CV DIV PREOP ?

## 2021-09-18 NOTE — Telephone Encounter (Signed)
? ?  Pre-operative Risk Assessment  ?  ?Patient Name: Wayne Marshall  ?DOB: August 14, 1950 ?MRN: [id number]  ? ?  ? ?Request for Surgical Clearance   ? ?Procedure:   right thumb ligamentous reconstruction radial collateral ligament with tendon graft and repair if necessary ? ?Date of Surgery:  Clearance TBD                              ?   ?Surgeon:  Dr. Roseanne Kaufman ?Surgeon's Group or Practice Name:  [facility] ?Phone number:  [phone] ?Fax number:  [phone] Attention Orson Slick ?  ?Type of Clearance Requested:   ?- Medical  ?  ?Type of Anesthesia:   Block with IV sedation ?  ?Additional requests/questions:   ? ?Signed, ?Yacine Droz Tressa Busman   ?09/18/2021, 9:31 AM  ? ?

## 2021-09-19 ENCOUNTER — Telehealth: Payer: Self-pay | Admitting: *Deleted

## 2021-09-19 ENCOUNTER — Telehealth: Payer: Self-pay

## 2021-09-19 DIAGNOSIS — E1169 Type 2 diabetes mellitus with other specified complication: Secondary | ICD-10-CM | POA: Diagnosis not present

## 2021-09-19 DIAGNOSIS — E785 Hyperlipidemia, unspecified: Secondary | ICD-10-CM | POA: Diagnosis not present

## 2021-09-19 DIAGNOSIS — Z01818 Encounter for other preprocedural examination: Secondary | ICD-10-CM | POA: Diagnosis not present

## 2021-09-19 DIAGNOSIS — I10 Essential (primary) hypertension: Secondary | ICD-10-CM | POA: Diagnosis not present

## 2021-09-19 DIAGNOSIS — E1159 Type 2 diabetes mellitus with other circulatory complications: Secondary | ICD-10-CM | POA: Diagnosis not present

## 2021-09-19 NOTE — Telephone Encounter (Signed)
Tele pre op appt 09/20/21 @ 1 pm. Med rec and consent are done. Pt tells me that he is not taking any blood thinners right now. He said that [name] office said they would send in Clopidogrel, though states this was never called in. He said he has been taken off ASA and finished the Brilinta he had. I assured the pt that I will send a message to Dr. Kem Parkinson nurse to d/w MD for further recommendations, and call him back with plan of care for blood thinner. Pt thanked me for the call.  ? ?  ?Patient Consent for Virtual Visit  ? ? ?   ? ?Wayne Marshall has provided verbal consent on 09/19/2021 for a virtual visit (video or telephone). ? ? ?CONSENT FOR VIRTUAL VISIT FOR:  Wayne Marshall  ?By participating in this virtual visit I agree to the following: ? ?I hereby voluntarily request, consent and authorize [facility] and its employed or contracted physicians, physician assistants, nurse practitioners or other licensed health care professionals (the Practitioner), to provide me with telemedicine health care services (the ?Services") as deemed necessary by the treating Practitioner. I acknowledge and consent to receive the Services by the Practitioner via telemedicine. I understand that the telemedicine visit will involve communicating with the Practitioner through live audiovisual communication technology and the disclosure of certain medical information by electronic transmission. I acknowledge that I have been given the opportunity to request an in-person assessment or other available alternative prior to the telemedicine visit and am voluntarily participating in the telemedicine visit. ? ?I understand that I have the right to withhold or withdraw my consent to the use of telemedicine in the course of my care at any time, without affecting my right to future care or treatment, and that the Practitioner or I may terminate the telemedicine visit at any time. I understand that I have the right to inspect  all information obtained and/or recorded in the course of the telemedicine visit and may receive copies of available information for a reasonable fee.  I understand that some of the potential risks of receiving the Services via telemedicine include:  ?Delay or interruption in medical evaluation due to technological equipment failure or disruption; ?Information transmitted may not be sufficient (e.g. poor resolution of images) to allow for appropriate medical decision making by the Practitioner; and/or  ?In rare instances, security protocols could fail, causing a breach of personal health information. ? ?Furthermore, I acknowledge that it is my responsibility to provide information about my medical history, conditions and care that is complete and accurate to the best of my ability. I acknowledge that Practitioner's advice, recommendations, and/or decision may be based on factors not within their control, such as incomplete or inaccurate data provided by me or distortions of diagnostic images or specimens that may result from electronic transmissions. I understand that the practice of medicine is not an exact science and that Practitioner makes no warranties or guarantees regarding treatment outcomes. I acknowledge that a copy of this consent can be made available to me via my patient portal [facility] MyChart), or I can request a printed copy by calling the office of [facility].   ? ?I understand that my insurance will be billed for this visit.  ? ?I have read or had this consent read to me. ?I understand the contents of this consent, which adequately explains the benefits and risks of the Services being provided via telemedicine.  ?I have been provided ample opportunity  to ask questions regarding this consent and the Services and have had my questions answered to my satisfaction. ?I give my informed consent for the services to be provided through the use of telemedicine in my medical care ? ? ?  ?

## 2021-09-19 NOTE — Telephone Encounter (Signed)
-----   Message from Tarri Fuller, CMA sent at 09/19/2021  3:11 PM EDT ----- ?Regarding: blood thinner; see notes ?Hi Wayne Marshall,  ? ?I was talking to the pt in regard to pre op appt. See notes below what he stated about his blood thinner. ? ?Thank you  ?Okey Regal  ? ?Tele pre op appt 09/20/21 @ 1 pm. Med rec and consent are done. Pt tells me that he is not taking any blood thinners right now. He said that [name] office said they would send in Clopidogrel, though states this was never called in. He said he has been taken off ASA and finished the Brilinta he had. I assured the pt that I will send a message to Dr. Kem Parkinson nurse to d/w MD for further recommendations, and call him back with plan of care for blood thinner. Pt thanked me for the call.  ? ?

## 2021-09-19 NOTE — Telephone Encounter (Signed)
Tele pre op appt 09/20/21 @ 1 pm. Med rec and consent are done. Pt tells me that he is not taking any blood thinners right now. He said that [name] office said they would send in Clopidogrel, though states this was never called in. He said he has been taken off ASA and finished the Brilinta he had. I assured the pt that I will send a message to Dr. Kem Parkinson nurse to d/w MD for further recommendations, and call him back with plan of care for blood thinner. Pt thanked me for the call.  ?

## 2021-09-19 NOTE — Telephone Encounter (Signed)
Please arrange virtual telephone visit with preop APP ? ?Per [name] "If the last intervention was more than a year ago patient can stop Brilinta and keep only aspirin." ?

## 2021-09-19 NOTE — Telephone Encounter (Signed)
Full VM 

## 2021-09-20 ENCOUNTER — Telehealth: Payer: Self-pay

## 2021-09-20 ENCOUNTER — Ambulatory Visit (INDEPENDENT_AMBULATORY_CARE_PROVIDER_SITE_OTHER): Payer: Medicare HMO | Admitting: General Practice

## 2021-09-20 ENCOUNTER — Encounter: Payer: Self-pay | Admitting: General Practice

## 2021-09-20 DIAGNOSIS — Z01810 Encounter for preprocedural cardiovascular examination: Secondary | ICD-10-CM

## 2021-09-20 NOTE — Progress Notes (Signed)
Attempted to contact patient as part of preoperative protocol. ?Each time the patient was called, the went to voicemail and his mailbox is full. ? ?Preoperative team, please contact requesting office and let them know we were unable to contact patient.  His procedure will need to be rescheduled.  Thank you for your help. ? ?Thomasene Ripple. Mena Lienau NP-C ? ?  ?09/20/2021, 1:19 PM ?[facility] ?[address] ?Office [phone] Fax [phone] ? ?

## 2021-09-20 NOTE — Telephone Encounter (Signed)
Full mailbox

## 2021-09-20 NOTE — Progress Notes (Signed)
I tried to call the pt as well and got message that vm is full. Could not leave a message. Pt will need to reschedule his telephone pre op call. I will update the requesting office that we were not able to reach the pt today for the pre op call.  ?

## 2021-09-20 NOTE — Telephone Encounter (Signed)
-----   Message from Carol M Fiato, CMA sent at 09/19/2021  3:11 PM EDT ----- ?Regarding: blood thinner; see notes ?Hi Kamarrion Stfort,  ? ?I was talking to the pt in regard to pre op appt. See notes below what he stated about his blood thinner. ? ?Thank you  ?Carol  ? ?Tele pre op appt 09/20/21 @ 1 pm. Med rec and consent are done. Pt tells me that he is not taking any blood thinners right now. He said that Dr. Revankar office said they would send in Clopidogrel, though states this was never called in. He said he has been taken off ASA and finished the Brilinta he had. I assured the pt that I will send a message to Dr. Revankar's nurse to d/w MD for further recommendations, and call him back with plan of care for blood thinner. Pt thanked me for the call.  ? ?

## 2021-10-03 DIAGNOSIS — Z1331 Encounter for screening for depression: Secondary | ICD-10-CM | POA: Diagnosis not present

## 2021-10-03 DIAGNOSIS — E1169 Type 2 diabetes mellitus with other specified complication: Secondary | ICD-10-CM | POA: Diagnosis not present

## 2021-10-03 DIAGNOSIS — I10 Essential (primary) hypertension: Secondary | ICD-10-CM | POA: Diagnosis not present

## 2021-10-03 DIAGNOSIS — E785 Hyperlipidemia, unspecified: Secondary | ICD-10-CM | POA: Diagnosis not present

## 2021-10-03 DIAGNOSIS — Z1389 Encounter for screening for other disorder: Secondary | ICD-10-CM | POA: Diagnosis not present

## 2021-10-03 DIAGNOSIS — Z1339 Encounter for screening examination for other mental health and behavioral disorders: Secondary | ICD-10-CM | POA: Diagnosis not present

## 2021-10-03 DIAGNOSIS — Z0000 Encounter for general adult medical examination without abnormal findings: Secondary | ICD-10-CM | POA: Diagnosis not present

## 2021-10-03 DIAGNOSIS — Z789 Other specified health status: Secondary | ICD-10-CM | POA: Diagnosis not present

## 2021-10-03 DIAGNOSIS — Z139 Encounter for screening, unspecified: Secondary | ICD-10-CM | POA: Diagnosis not present

## 2021-10-17 DIAGNOSIS — E1159 Type 2 diabetes mellitus with other circulatory complications: Secondary | ICD-10-CM | POA: Diagnosis not present

## 2021-10-17 DIAGNOSIS — Z6824 Body mass index (BMI) 24.0-24.9, adult: Secondary | ICD-10-CM | POA: Diagnosis not present

## 2021-10-17 DIAGNOSIS — I152 Hypertension secondary to endocrine disorders: Secondary | ICD-10-CM | POA: Diagnosis not present

## 2021-10-20 DIAGNOSIS — M79675 Pain in left toe(s): Secondary | ICD-10-CM | POA: Diagnosis not present

## 2021-10-31 DIAGNOSIS — Z1329 Encounter for screening for other suspected endocrine disorder: Secondary | ICD-10-CM | POA: Diagnosis not present

## 2021-10-31 DIAGNOSIS — Z6824 Body mass index (BMI) 24.0-24.9, adult: Secondary | ICD-10-CM | POA: Diagnosis not present

## 2021-10-31 DIAGNOSIS — I10 Essential (primary) hypertension: Secondary | ICD-10-CM | POA: Diagnosis not present

## 2021-11-06 DIAGNOSIS — I10 Essential (primary) hypertension: Secondary | ICD-10-CM | POA: Diagnosis not present

## 2021-11-06 DIAGNOSIS — Z789 Other specified health status: Secondary | ICD-10-CM | POA: Diagnosis not present

## 2021-11-06 DIAGNOSIS — Z6824 Body mass index (BMI) 24.0-24.9, adult: Secondary | ICD-10-CM | POA: Diagnosis not present

## 2021-11-09 DIAGNOSIS — E1122 Type 2 diabetes mellitus with diabetic chronic kidney disease: Secondary | ICD-10-CM | POA: Diagnosis not present

## 2021-11-09 DIAGNOSIS — I10 Essential (primary) hypertension: Secondary | ICD-10-CM | POA: Diagnosis not present

## 2021-11-10 DIAGNOSIS — E1122 Type 2 diabetes mellitus with diabetic chronic kidney disease: Secondary | ICD-10-CM | POA: Diagnosis not present

## 2021-11-10 DIAGNOSIS — E782 Mixed hyperlipidemia: Secondary | ICD-10-CM | POA: Diagnosis not present

## 2021-11-15 DIAGNOSIS — E1159 Type 2 diabetes mellitus with other circulatory complications: Secondary | ICD-10-CM | POA: Diagnosis not present

## 2021-11-15 DIAGNOSIS — Z23 Encounter for immunization: Secondary | ICD-10-CM | POA: Diagnosis not present

## 2021-11-15 DIAGNOSIS — Z0000 Encounter for general adult medical examination without abnormal findings: Secondary | ICD-10-CM | POA: Diagnosis not present

## 2021-11-15 DIAGNOSIS — I152 Hypertension secondary to endocrine disorders: Secondary | ICD-10-CM | POA: Diagnosis not present

## 2021-11-15 DIAGNOSIS — Z6824 Body mass index (BMI) 24.0-24.9, adult: Secondary | ICD-10-CM | POA: Diagnosis not present

## 2021-11-26 DIAGNOSIS — I152 Hypertension secondary to endocrine disorders: Secondary | ICD-10-CM | POA: Diagnosis not present

## 2021-11-26 DIAGNOSIS — E1159 Type 2 diabetes mellitus with other circulatory complications: Secondary | ICD-10-CM | POA: Diagnosis not present

## 2021-11-26 DIAGNOSIS — Z6824 Body mass index (BMI) 24.0-24.9, adult: Secondary | ICD-10-CM | POA: Diagnosis not present

## 2021-11-27 DIAGNOSIS — E1159 Type 2 diabetes mellitus with other circulatory complications: Secondary | ICD-10-CM | POA: Diagnosis not present

## 2021-11-28 DIAGNOSIS — R0989 Other specified symptoms and signs involving the circulatory and respiratory systems: Secondary | ICD-10-CM | POA: Diagnosis not present

## 2021-11-28 DIAGNOSIS — I493 Ventricular premature depolarization: Secondary | ICD-10-CM | POA: Diagnosis not present

## 2021-11-30 ENCOUNTER — Telehealth: Payer: Self-pay | Admitting: Cardiology

## 2021-11-30 NOTE — Telephone Encounter (Signed)
   Pre-operative Risk Assessment    Patient Name: Wayne Marshall  DOB: 15-Apr-1951 MRN: [id number]     Request for Surgical Clearance    Procedure:  Dental Extraction - Amount of Teeth to be Pulled:  3  Date of Surgery:  Clearance TBD                                 Surgeon:  Dr. Angela Adam & Dr. Zena Amos Surgeon's Group or Practice Name:  Dr. Angela Adam & Dr. Zena Amos Phone number:  [phone] Fax number:  [phone]   Type of Clearance Requested:   - Medical    Type of Anesthesia:   Epinephrine    Additional requests/questions:  Please advise surgeon/provider what medications should be held.  Signed, Bolivar Haw   11/30/2021, 7:43 AM

## 2021-11-30 NOTE — Telephone Encounter (Signed)
Primary Cardiologist:Rajan R Revankar, MD   Preoperative team, please contact this patient and set up a phone call appointment for further preoperative risk assessment. Please obtain consent and complete medication review. Thank you for your help.   I confirm that guidance regarding antiplatelet and oral anticoagulation therapy has been completed and, if necessary, noted below.  Please note, we are happy to assist in the process of clearing patient for his dental extractions but we have multiple documented attempts of not being able to reach the patient to clear him for procedures and/or to discuss medications.  Ideally aspirin should be continued without interruption, however if the bleeding risk is too great, aspirin may be held for 7 days prior to surgery. Please resume aspirin post operatively when it is felt to be safe from a bleeding standpoint.   Levi Aland, NP-C    11/30/2021, 3:48 PM [facility] [address], [address] Office [phone] Fax [phone]

## 2021-12-03 NOTE — Telephone Encounter (Signed)
Tried to reach the pt to set up a tele pre op appt, though no answer and no vm came on

## 2021-12-04 NOTE — Telephone Encounter (Signed)
Left message for the pt to call for tele pre op appt 

## 2021-12-06 NOTE — Telephone Encounter (Signed)
Left message x 3 to call for tele appt. I will send FYI to requesting office the pt will need a tele appt fpr pre op clearance. I will send the pt a letter to call the office

## 2021-12-10 DIAGNOSIS — I10 Essential (primary) hypertension: Secondary | ICD-10-CM | POA: Diagnosis not present

## 2021-12-10 DIAGNOSIS — E1122 Type 2 diabetes mellitus with diabetic chronic kidney disease: Secondary | ICD-10-CM | POA: Diagnosis not present

## 2021-12-11 DIAGNOSIS — E1122 Type 2 diabetes mellitus with diabetic chronic kidney disease: Secondary | ICD-10-CM | POA: Diagnosis not present

## 2021-12-11 DIAGNOSIS — E782 Mixed hyperlipidemia: Secondary | ICD-10-CM | POA: Diagnosis not present

## 2021-12-25 DIAGNOSIS — E1159 Type 2 diabetes mellitus with other circulatory complications: Secondary | ICD-10-CM | POA: Diagnosis not present

## 2021-12-25 DIAGNOSIS — I152 Hypertension secondary to endocrine disorders: Secondary | ICD-10-CM | POA: Diagnosis not present

## 2021-12-25 DIAGNOSIS — Z6824 Body mass index (BMI) 24.0-24.9, adult: Secondary | ICD-10-CM | POA: Diagnosis not present

## 2021-12-31 ENCOUNTER — Telehealth: Payer: Self-pay

## 2021-12-31 NOTE — Telephone Encounter (Signed)
Spoke with patient who is agreeable to do a tele visit on 8/29 at 9 am. Med rec and consent are done. Patient thanked me for the call.

## 2021-12-31 NOTE — Telephone Encounter (Signed)
  Patient Consent for Virtual Visit        Wayne Marshall has provided verbal consent on 12/31/2021 for a virtual visit (video or telephone).   CONSENT FOR VIRTUAL VISIT FOR:  Wayne Marshall  By participating in this virtual visit I agree to the following:  I hereby voluntarily request, consent and authorize [facility] and its employed or contracted physicians, physician assistants, nurse practitioners or other licensed health care professionals (the Practitioner), to provide me with telemedicine health care services (the "Services") as deemed necessary by the treating Practitioner. I acknowledge and consent to receive the Services by the Practitioner via telemedicine. I understand that the telemedicine visit will involve communicating with the Practitioner through live audiovisual communication technology and the disclosure of certain medical information by electronic transmission. I acknowledge that I have been given the opportunity to request an in-person assessment or other available alternative prior to the telemedicine visit and am voluntarily participating in the telemedicine visit.  I understand that I have the right to withhold or withdraw my consent to the use of telemedicine in the course of my care at any time, without affecting my right to future care or treatment, and that the Practitioner or I may terminate the telemedicine visit at any time. I understand that I have the right to inspect all information obtained and/or recorded in the course of the telemedicine visit and may receive copies of available information for a reasonable fee.  I understand that some of the potential risks of receiving the Services via telemedicine include:  Delay or interruption in medical evaluation due to technological equipment failure or disruption; Information transmitted may not be sufficient (e.g. poor resolution of images) to allow for appropriate medical decision making by the Practitioner;  and/or  In rare instances, security protocols could fail, causing a breach of personal health information.  Furthermore, I acknowledge that it is my responsibility to provide information about my medical history, conditions and care that is complete and accurate to the best of my ability. I acknowledge that Practitioner's advice, recommendations, and/or decision may be based on factors not within their control, such as incomplete or inaccurate data provided by me or distortions of diagnostic images or specimens that may result from electronic transmissions. I understand that the practice of medicine is not an exact science and that Practitioner makes no warranties or guarantees regarding treatment outcomes. I acknowledge that a copy of this consent can be made available to me via my patient portal [facility] MyChart), or I can request a printed copy by calling the office of [facility].    I understand that my insurance will be billed for this visit.   I have read or had this consent read to me. I understand the contents of this consent, which adequately explains the benefits and risks of the Services being provided via telemedicine.  I have been provided ample opportunity to ask questions regarding this consent and the Services and have had my questions answered to my satisfaction. I give my informed consent for the services to be provided through the use of telemedicine in my medical care

## 2022-01-08 ENCOUNTER — Encounter: Payer: Self-pay | Admitting: Physician Assistant

## 2022-01-08 ENCOUNTER — Ambulatory Visit: Payer: Medicare Other | Attending: Cardiology | Admitting: Physician Assistant

## 2022-01-08 DIAGNOSIS — Z01810 Encounter for preprocedural cardiovascular examination: Secondary | ICD-10-CM | POA: Diagnosis not present

## 2022-01-08 DIAGNOSIS — I2510 Atherosclerotic heart disease of native coronary artery without angina pectoris: Secondary | ICD-10-CM | POA: Diagnosis not present

## 2022-01-08 MED ORDER — CLOPIDOGREL BISULFATE 75 MG PO TABS: 75.0000 mg | ORAL_TABLET | Freq: Every day | ORAL | 3 refills | Status: DC

## 2022-01-08 NOTE — Progress Notes (Addendum)
Virtual Visit via Telephone Note   Because of Newton Hajj co-morbid illnesses, he is at least at moderate risk for complications without adequate follow up.  This format is felt to be most appropriate for this patient at this time.  The patient did not have access to video technology/had technical difficulties with video requiring transitioning to audio format only (telephone).  All issues noted in this document were discussed and addressed.  No physical exam could be performed with this format.  Please refer to the patient's chart for his consent to telehealth for [facility]  Evaluation Performed:  Preoperative cardiovascular risk assessment _____________   Date:  01/08/2022   Patient ID:  Wayne Marshall, DOB 1950-11-04, MRN [id number] Patient Location:  Home Provider location:   Office  Primary Care Provider:  Marco Collie, MD Primary Cardiologist:  Jenean Lindau, MD  Chief Complaint / Patient Profile   71 y.o. y/o male with a h/o  Coronary artery disease Inf STEMI 06/2020 s/p 2.5 x 20 mm DES to [facility] Staged PCI 06/2020: 3.5 x 24 mm DES to proximal LAD Residual CAD: OM1 90, ostial LCx 70, mid LCx 90-small vessel, diffusely diseased-med Rx unless refractory symptoms Echo 06/19/2020: EF 55-60, trivial MR, trivial AI Echo 03/13/2021: EF 55-60, no RWMA, trace to mild AI Hypertension  Hyperlipidemia  Chronic kidney disease (serum creatinine 7/22: 1.31) Diabetes mellitus  Hx of CVA  who is pending right thumb ligamentous reconstruction radial collateral ligament with tendon graft and repair if necessary with Dr. Amedeo Plenty under regional block with IV sedation.  He presents today for telephonic preoperative cardiovascular risk assessment.  Of note, we have also received a request to clear him for dental extraction x3.  Past Medical History    Past Medical History:  Diagnosis Date   Acute respiratory failure with hypoxia ([place]) 06/30/2019   AKI (acute kidney injury)  ([place]) 06/30/2019   CAD (coronary artery disease) 06/29/2020   Chronic kidney disease    CKD stage 3 secondary to diabetes ([place]) 06/30/2019   COVID-19 2021   Diabetes mellitus without complication (HCC)    DM2 (diabetes mellitus, type 2) ([place]) 06/30/2019   HTN (hypertension) 06/30/2019   Hypertension    Hypoxemia    Impingement syndrome of left shoulder region 11/12/2017   Mixed dyslipidemia 12/01/2020   Pneumonia due to COVID-19 virus 06/30/2019   STEMI (ST elevation myocardial infarction) ([place]) 06/19/2020   STEMI involving right coronary artery ([place]) 06/19/2020   Stroke [facility])    Past Surgical History:  Procedure Laterality Date   CORONARY STENT INTERVENTION N/A 06/19/2020   Procedure: CORONARY STENT INTERVENTION;  Surgeon: Nelva Bush, MD;  Location: [place] [facility];  Service: Cardiovascular;  Laterality: N/A;   CORONARY STENT INTERVENTION N/A 06/21/2020   Procedure: CORONARY STENT INTERVENTION;  Surgeon: Wellington Hampshire, MD;  Location: [place] [facility];  Service: Cardiovascular;  Laterality: N/A;   INTRAVASCULAR ULTRASOUND/IVUS N/A 06/21/2020   Procedure: Intravascular Ultrasound/IVUS;  Surgeon: Wellington Hampshire, MD;  Location: [place] [facility];  Service: Cardiovascular;  Laterality: N/A;   LEFT HEART CATH AND CORONARY ANGIOGRAPHY N/A 06/19/2020   Procedure: LEFT HEART CATH AND CORONARY ANGIOGRAPHY;  Surgeon: Nelva Bush, MD;  Location: [place] [facility];  Service: Cardiovascular;  Laterality: N/A;    Allergies  No Known Allergies  History of Present Illness    Wayne Marshall is a 71 y.o. male who presents via audio/video conferencing for a telehealth visit today.  Pt was last seen in cardiology clinic by [name] on 05/30/2021. At that time he was doing well with plans for f/u in 9 mos.  The patient is now pending procedure as outlined above. Since his last visit, he has done well without chest pain, shortness of breath, syncope.  He remains active and walks his dog 3  times a day.  He notes that he was recently seen by primary care for uncontrolled blood pressure.  His most recent blood pressures have been closer to optimal.  Blood pressure yesterday was 141/78.  Of note, he has stopped taking aspirin and Brilinta.  He could not get the Brilinta refilled recently.  Notes in his chart indicate the plan was to switch him to Plavix.   Home Medications    Prior to Admission medications   Medication Sig Start Date End Date Taking? Authorizing Provider  allopurinol (ZYLOPRIM) 100 MG tablet Take 100 mg by mouth daily.    [provider]  aspirin 81 MG chewable tablet Chew 1 tablet (81 mg total) by mouth daily. Patient not taking: Reported on 12/31/2021 06/22/20   Laverda Page B, NP  atorvastatin (LIPITOR) 80 MG tablet Take 1 tablet (80 mg total) by mouth daily. 06/22/20   Arty Baumgartner, NP  carvedilol (COREG) 6.25 MG tablet Take 6.25 mg by mouth 2 (two) times daily with a meal.    [provider]  guaiFENesin-dextromethorphan (ROBITUSSIN DM) 100-10 MG/5ML syrup Take 10 mLs by mouth every 4 (four) hours as needed for cough. 06/30/19   Calvert Cantor, MD  hydrALAZINE (APRESOLINE) 10 MG tablet Take 10 mg by mouth 3 (three) times daily. 11/25/21   [provider]  hydrochlorothiazide (HYDRODIURIL) 25 MG tablet Take 25 mg by mouth daily. 12/25/21   [provider]  Ipratropium-Albuterol (COMBIVENT) 20-100 MCG/ACT AERS respimat Inhale 1 puff into the lungs every 6 (six) hours as needed for wheezing or shortness of breath. 06/30/19   Calvert Cantor, MD  metFORMIN (GLUCOPHAGE) 1000 MG tablet Take 1,000 mg by mouth 2 (two) times daily with a meal.    [provider]  Multiple Vitamin (MULTIVITAMIN WITH MINERALS) TABS tablet Take 1 tablet by mouth daily.    [provider]  nitroGLYCERIN (NITROSTAT) 0.4 MG SL tablet Place 0.4 mg under the tongue every 5 (five) minutes as needed for chest pain.    [provider]   Psyllium (METAMUCIL FIBER PO) Take 1 tablet by mouth at bedtime.    [provider]  ticagrelor (BRILINTA) 90 MG TABS tablet Take 1 tablet (90 mg total) by mouth 2 (two) times daily. Patient not taking: Reported on 12/31/2021 02/07/21   Revankar, Aundra Dubin, MD  valsartan (DIOVAN) 160 MG tablet Take 160 mg by mouth daily. 12/10/21   [provider]  valsartan-hydrochlorothiazide (DIOVAN-HCT) 320-12.5 MG tablet Take 1 tablet by mouth daily. Patient not taking: Reported on 12/31/2021 03/21/20   [provider]    Physical Exam    Vital Signs:  Jassiel Flye does not have vital signs available for review today.  Given telephonic nature of communication, physical exam is limited. AAOx3. NAD. Normal affect.  Speech and respirations are unlabored.  Accessory Clinical Findings    None  Assessment & Plan    1.  Preoperative Cardiovascular Risk Assessment:    Mr. Colquhoun perioperative risk of a major cardiac event is 6.6% according to the Revised Cardiac Risk Index (RCRI).  Therefore, he is at high risk for perioperative complications.  His functional capacity is good at 4.31 METs according to the Duke Activity Status Index (DASI). Recommendations: According to ACC/AHA guidelines, no further cardiovascular testing needed.  The patient may proceed to surgery at acceptable risk.   Antiplatelet and/or Anticoagulation Recommendations: The patient should remain on Aspirin without interruption.   If possible, we recommend continuing Clopidogrel. However, if the bleeding risk is too great, Clopidogrel (Plavix) can be held for 5 days prior to his surgery and resumed as soon as possible post op. The patient does not need SBE prophylaxis.   2.  Coronary artery disease: As noted, the patient expressed that he had stopped taking aspirin and Brilinta.  He is greater than 1 year out from his inferior STEMI treated with PCI of the RCA and LAD.  There was some discussion in his  chart around switching him to Plavix.  He notes the cost of Brilinta did go up significantly after 1 year.  I have recommended that he resume aspirin 81 mg daily.  I have also asked him to start taking Plavix 75 mg daily.  A prescription will be sent to his pharmacy.  He will continue on dual antiplatelet therapy until he sees [name] in follow-up.  Further recommendations regarding long-term dual antiplatelet therapy can be made at that time.   A copy of this note will be routed to requesting surgeon.  Time:   Today, I have spent 15 minutes with the patient with telehealth technology discussing medical history, symptoms, and management plan.     Tereso Newcomer, PA-C  01/08/2022, 10:20 AM

## 2022-01-08 NOTE — Progress Notes (Signed)
Per Tereso Newcomer, PAC, I called the pt and he has been advised and instructed on Plavix 75 mg. Pt aware to resume Plavix 75 mg once a day after his procedure has been done and safe to resume. Pt aware as well as to resume ASA 81 mg daily. Pt thanked me for the call.

## 2022-01-08 NOTE — Patient Instructions (Signed)
Medication Instructions:  Please start back on Aspirin 81 mg once daily. Do not stop this for your surgery.  DO NOT take Ticagrelor (Brilinta).  Start Clopidogrel (Plavix) 75 mg once daily (prescription sent to your pharmacy) Ideally, we would recommend you remain on aspirin and Plavix without stopping. However, if the surgeon is concerned about bleeding, it is ok to hold this medication for 5 days prior to your procedure and resume it after when the surgeon says it is safe.

## 2022-01-10 DIAGNOSIS — I10 Essential (primary) hypertension: Secondary | ICD-10-CM | POA: Diagnosis not present

## 2022-01-10 DIAGNOSIS — E1122 Type 2 diabetes mellitus with diabetic chronic kidney disease: Secondary | ICD-10-CM | POA: Diagnosis not present

## 2022-01-11 DIAGNOSIS — E782 Mixed hyperlipidemia: Secondary | ICD-10-CM | POA: Diagnosis not present

## 2022-01-11 DIAGNOSIS — E1122 Type 2 diabetes mellitus with diabetic chronic kidney disease: Secondary | ICD-10-CM | POA: Diagnosis not present

## 2022-01-16 ENCOUNTER — Telehealth: Payer: Self-pay

## 2022-01-16 NOTE — Telephone Encounter (Signed)
Patient is not on anticoagulation. Looks like his plavix was addressed by Lorin Picket on 8/29 in pre-op visit.

## 2022-01-16 NOTE — Telephone Encounter (Signed)
   Pre-operative Risk Assessment    Patient Name: Myles Tavella  DOB: 25-Aug-1950 MRN: [id number]      Request for Surgical Clearance    Procedure:   Radial collateral ligament with tendon graft and repair as necessary  Date of Surgery:  Clearance 02/05/22                                 Surgeon:  Dr. Casandra Doffing  Surgeon's Group or Practice Name:  [facility] Phone number:  [phone] Fax number:  [phone] { 4. What type of clearance is requested?  Medical or Cardiac Clearance only?  Pharmacy Clearance Only (Request is to hold medication only)?  Or Both?  Press F2 and select the clearance requested.  If both ar- Medical e needed, select both from the drop down list.     :1}  Type of Clearance Requested:      Type of Anesthesia:   Block with IV sedation   Additional requests/questions:    Signed, Eleonore Chiquito   01/16/2022, 7:55 AM

## 2022-01-30 ENCOUNTER — Telehealth: Payer: Self-pay | Admitting: Cardiology

## 2022-01-30 NOTE — Telephone Encounter (Signed)
Patient would like to know which medications he needs to stop taking prior to his surgery.

## 2022-01-30 NOTE — Telephone Encounter (Signed)
Patient states he is calling in concerning his clearance, he has some questions. Please advise

## 2022-01-30 NOTE — Telephone Encounter (Signed)
I s/w the pt and read the instructions for medication if needed to be held. Per [facility] Dopp [facility] notes to continue ASA and not stop for this surgery. Preferable to remain on Plavix as well, however if surgeon feels Plavix needs to be held to bleeding, then ok to hold Plavix x 5 days. I will re-fax the notes the requesting office. The pt thanked me for my help and call.

## 2022-02-01 ENCOUNTER — Encounter (HOSPITAL_COMMUNITY): Payer: Self-pay | Admitting: Orthopedic Surgery

## 2022-02-01 ENCOUNTER — Other Ambulatory Visit: Payer: Self-pay

## 2022-02-01 NOTE — Pre-Procedure Instructions (Signed)
SDW CALL  Patient was given pre-op instructions over the phone. The opportunity was given for the patient to ask questions. No further questions asked. Patient verbalized understanding of instructions given.   PCP - Maryella Shivers, MD Cardiologist - Revankar, Reita Cliche, MD  PPM/ICD - denies   Chest x-ray - N/A EKG - DOS Stress Test - denies stress test ECHO - 03/14/21 Cardiac Cath - 06/21/20  Sleep Study - dnies   Fasting Blood Sugar - does not check blood sugar at home.    Blood Thinner Instructions: pt states he will call surgeon's office as soon as he gets off the phone to confirm whether he needs to stop or keep taking Plavix and ASA. Pt reports he has not stopped taking these medications yet.    ERAS Protcol - ERAS per order   COVID TEST- N/A   Anesthesia review: yes- hx CAD- cardiac clearance, pt denies recent cardiac issues other than elevated blood pressures recently (148/92, 145/88, 121/76 readings at home within the last couple of days per patient report)  Patient denies shortness of breath, fever, cough and chest pain over the phone call    Surgical Instructions    Your procedure is scheduled on Tuesday, September 26  Report to [facility] Main Entrance "A" at 12:45 P.M., then check in with the Admitting office.  Call this number if you have problems the morning of surgery:  [phone]    Remember:  Do not eat after midnight the night before your surgery  You may drink clear liquids until 12:15 PM the morning of your surgery.   Clear liquids allowed are: Water, Non-Citrus Juices (without pulp), Carbonated Beverages, Clear Tea, Black Coffee ONLY (NO MILK, CREAM OR POWDERED CREAMER of any kind), and Gatorade   Take these medicines the morning of surgery with A SIP OF WATER: atorvastatin, carvedilol, hydralazine  Follow your surgeon's instructions on when to stop taking Aspirin and Plavix.  If no instructions were given by your surgeon then you will need  to call the office to get those instructions.    WHAT DO I DO ABOUT MY DIABETES MEDICATION?   Do not take oral diabetes medicines (pills) the morning of surgery. DO NOT take Metformin (Glucophage) the day of surgery.       Check your blood sugar the morning of your surgery when you wake up and every 2 hours until you get to the Short Stay unit.  If your blood sugar is less than 70 mg/dL, you will need to treat for low blood sugar: Do not take insulin. Treat a low blood sugar (less than 70 mg/dL) with  cup of clear juice (cranberry or apple), 4 glucose tablets, OR glucose gel. Recheck blood sugar in 15 minutes after treatment (to make sure it is greater than 70 mg/dL). If your blood sugar is not greater than 70 mg/dL on recheck, call [phone] for further instructions.    As of today, STOP taking any Aleve, Naproxen, Ibuprofen, Motrin, Advil, Goody's, BC's, all herbal medications, fish oil, and all vitamins.  [facility] is not responsible for any belongings or valuables.    Contacts, glasses, hearing aids, dentures or partials may not be worn into surgery, please bring cases for these belongings   Patients discharged the day of surgery will not be allowed to drive home, and someone needs to stay with them for 24 hours.   SURGICAL WAITING ROOM VISITATION You may have 1 visitor in the pre-op area at a time determined  by the pre-op nurse. (Visitor may not switch out)  They may stay in the waiting area during the procedure and may switch out with other visitors. If the patient needs to stay at the hospital during part of their recovery, the visitor guidelines for inpatient rooms apply.      Special instructions:    Oral Hygiene is also important to reduce your risk of infection.  Remember - BRUSH YOUR TEETH THE MORNING OF SURGERY WITH YOUR REGULAR TOOTHPASTE   Day of Surgery:  Take a shower the day of and night before with antibacterial soap. Wear Clean/Comfortable  clothing the morning of surgery Do not apply any deodorants/lotions.   Do not wear jewelry  Do not wear lotions, powders, perfumes/colognes, or deodorant. Do not shave 48 hours prior to surgery.  Men may shave face and neck. Do not bring valuables to the hospital. Do not wear nail polish, gel polish, artificial nails, or any other type of covering on natural nails (fingers and toes)

## 2022-02-04 ENCOUNTER — Encounter (HOSPITAL_COMMUNITY): Payer: Self-pay | Admitting: Orthopedic Surgery

## 2022-02-04 NOTE — Anesthesia Preprocedure Evaluation (Signed)
Anesthesia Evaluation  Patient identified by MRN, date of birth, ID band Patient awake    Reviewed: Allergy & Precautions, NPO status , Patient's Chart, lab work & pertinent test results, reviewed documented beta blocker date and time   History of Anesthesia Complications Negative for: history of anesthetic complications  Airway Mallampati: II  TM Distance: >3 FB Neck ROM: Full    Dental  (+) Dental Advisory Given   Pulmonary neg pulmonary ROS,    Pulmonary exam normal        Cardiovascular hypertension, Pt. on medications and Pt. on home beta blockers + CAD, + Past MI and + Cardiac Stents  Normal cardiovascular exam   '22 TTE - EF 55-60%. Mild AI    Neuro/Psych CVA, No Residual Symptoms negative psych ROS   GI/Hepatic negative GI ROS, Neg liver ROS,   Endo/Other  diabetes, Type 2, Oral Hypoglycemic Agents  Renal/GU CRFRenal disease     Musculoskeletal  (+) Arthritis ,   Abdominal   Peds  Hematology  On plavix    Anesthesia Other Findings   Reproductive/Obstetrics                         Anesthesia Physical Anesthesia Plan  ASA: 3  Anesthesia Plan: Regional   Post-op Pain Management: Regional block* and Tylenol PO (pre-op)*   Induction:   PONV Risk Score and Plan: 1 and Propofol infusion and Treatment may vary due to age or medical condition  Airway Management Planned: Natural Airway and Simple Face Mask  Additional Equipment: None  Intra-op Plan:   Post-operative Plan:   Informed Consent: I have reviewed the patients History and Physical, chart, labs and discussed the procedure including the risks, benefits and alternatives for the proposed anesthesia with the patient or authorized representative who has indicated his/her understanding and acceptance.       Plan Discussed with: CRNA and Anesthesiologist  Anesthesia Plan Comments:       Anesthesia Quick  Evaluation

## 2022-02-04 NOTE — Progress Notes (Signed)
Anesthesia Chart Review: SAME DAY WORK-UP  Case: [id number] Date/Time: 02/05/22 1500   Procedure: Right thumb ligamentous reconstruction radial collateral ligament with tendon graft and repair is necessary (Right) - 90 MINS BLOCK WITH IV SEDATION   Anesthesia type: Regional   Pre-op diagnosis: Right thumb radial collateral ligament injury with instability at the metacarpal phalangeal joint   Location: [facility] ROOM 04 / [facility]   Surgeons: Dominica Severin, MD       DISCUSSION: Patient is a 71 year old male scheduled for the above procedure.  History includes never smoker, HTN, dyslipidemia, murmur (trace-mild AI 03/14/21 echo), CAD (inferior STEMI 06/19/20, s/p DES mid RCA & staged DES  proximal LAD 06/21/20, medical therapy for diffuse LCX disease), DM2, CVA (04/19/11), CKD.  He had telephonic preoperative cardiology evaluation with Tereso Newcomer, PA-C on 01/08/22. He wrote, " Preoperative Cardiovascular Risk Assessment:    Mr. Kidane perioperative risk of a major cardiac event is 6.6% according to the Revised Cardiac Risk Index (RCRI).  Therefore, he is at high risk for perioperative complications.   His functional capacity is good at 4.31 METs according to the Duke Activity Status Index (DASI). Recommendations: According to ACC/AHA guidelines, no further cardiovascular testing needed.  The patient may proceed to surgery at acceptable risk.   Antiplatelet and/or Anticoagulation Recommendations: The patient should remain on Aspirin without interruption.   If possible, we recommend continuing Clopidogrel. However, if the bleeding risk is too great, Clopidogrel (Plavix) can be held for 5 days prior to his surgery and resumed as soon as possible post op. The patient does not need SBE prophylaxis.    2.  Coronary artery disease: As noted, the patient expressed that he had stopped taking aspirin and Brilinta.  He is greater than 1 year out from his inferior STEMI treated with PCI of the RCA and LAD.  There  was some discussion in his chart around switching him to Plavix.  He notes the cost of Brilinta did go up significantly after 1 year.  I have recommended that he resume aspirin 81 mg daily.  I have also asked him to start taking Plavix 75 mg daily.  A prescription will be sent to his pharmacy.  He will continue on dual antiplatelet therapy until he sees [name] in follow-up.  Further recommendations regarding long-term dual antiplatelet therapy can be made at that time."  Confirmed with Cordelia Pen at Dr. Carlos Levering office that she spoke with him on 02/01/22. He is holding Plavix, but continuing ASA. Currently, last available labs are > 30 days and EKG > 1 year, so will get as indicated on arrival.  Anesthesia team to evaluate on the day of surgery.   VS:  BP Readings from Last 3 Encounters:  05/30/21 140/76  12/01/20 128/70  08/22/20 134/74   Pulse Readings from Last 3 Encounters:  05/30/21 64  12/01/20 70  08/22/20 72     PROVIDERS: Charlott Rakes, MD is PCP  Belva Crome, MD is cardiologist   LABS: For day of surgery as indicated. Creatinine 1.31-1.54 ~ 06/2020-11/2020 and was 1.22 on 05/16/21. A1c 6.1% on 05/16/21 (scanned under Media tab).   EKG: Last EKG seen is > 1 year ago. SR with occasional PVCs on 06/29/20.    CV: Echo 03/13/21 [facility]): Conclusions: 1.  The left ventricular wall thickness is normal. 2.  There is normal global left ventricular contractility. 3.  Overall left ventricular systolic function is normal with an EF between 55 and 60%. 4.  The  diastolic filling pattern indicates impaired relaxation with normal filling pressure. 5.  No regional wall motion abnormalities were noted. 6.  Trace to mild aortic regurgitation.  Cardiac cath 06/21/20: 1st Diag lesion is 50% stenosed. Prox LAD lesion is 80% stenosed. 1st Mrg lesion is 90% stenosed. Mid Cx lesion is 90% stenosed. Ost Cx to Prox Cx lesion is 70% stenosed. Mid LAD lesion is 50% stenosed. A  drug-eluting stent was successfully placed using a SYNERGY XD [id number]. Post intervention, there is a 0% residual stenosis. Post intervention, there is a 0% residual stenosis.   Successful OCT-guided angioplasty and drug-eluting stent placement to the proximal LAD.   Recommendations: Initial plans were to possibly intervene on the left circumflex but angiography showed diffuse disease extending all the way back to the ostium with relatively small vessel diameter.  I felt that medical therapy is probably best and reserving PCI for refractory anginal symptoms.   US Carotid 04/20/11 (PACS/[facility]) IMPRESSION: 1. No evidence of hemodynamically significant stenosis involving either the right or left carotid circulation in the neck by Doppler criteria. 2. Antegrade flow in both vertebral arteries. 3. Moderate noncalcified plaque in the carotid bulbs and proximal ICAs bilaterally. 4. Multiple bilateral thyroid nodules, some of which may contain microcalcifications. Dedicated thyroid ultrasound is suggested in further evaluation.  - Thyroid US 04/30/11 showed small bilateral nodules. S/p left thyroid biopsy 05/30/11.   Past Medical History:  Diagnosis Date   Acute respiratory failure with hypoxia ([place]) 06/30/2019   AKI (acute kidney injury) ([place]) 06/30/2019   CAD (coronary artery disease) 06/29/2020   Chronic kidney disease    COVID-19 2021   DM2 (diabetes mellitus, type 2) ([place]) 06/30/2019   Heart murmur    trace-mild AI 03/14/21 echo   HTN (hypertension) 06/30/2019   Hypoxemia    Impingement syndrome of left shoulder region 11/12/2017   Mixed dyslipidemia 12/01/2020   Pneumonia due to COVID-19 virus 06/30/2019   STEMI involving right coronary artery ([place]) 06/19/2020   Stroke ([place]) 04/19/2011    Past Surgical History:  Procedure Laterality Date   CORONARY STENT INTERVENTION N/A 06/19/2020   Procedure: CORONARY STENT INTERVENTION;  Surgeon: Nelva Bush, MD;  Location: [place] [facility];  Service: Cardiovascular;  Laterality: N/A;   CORONARY STENT INTERVENTION N/A 06/21/2020   Procedure: CORONARY STENT INTERVENTION;  Surgeon: Wellington Hampshire, MD;  Location: [place] [facility] LAB;  Service: Cardiovascular;  Laterality: N/A;   INTRAVASCULAR ULTRASOUND/IVUS N/A 06/21/2020   Procedure: Intravascular Ultrasound/IVUS;  Surgeon: Wellington Hampshire, MD;  Location: [place] [facility] LAB;  Service: Cardiovascular;  Laterality: N/A;   LEFT HEART CATH AND CORONARY ANGIOGRAPHY N/A 06/19/2020   Procedure: LEFT HEART CATH AND CORONARY ANGIOGRAPHY;  Surgeon: Nelva Bush, MD;  Location: [place] [facility];  Service: Cardiovascular;  Laterality: N/A;   SHOULDER ARTHROSCOPY Left    Dr Onnie Graham    MEDICATIONS: No current facility-administered medications for this encounter.    aspirin 81 MG chewable tablet   atorvastatin (LIPITOR) 80 MG tablet   carvedilol (COREG) 6.25 MG tablet   clopidogrel (PLAVIX) 75 MG tablet   hydrALAZINE (APRESOLINE) 10 MG tablet   hydrochlorothiazide (HYDRODIURIL) 25 MG tablet   metFORMIN (GLUCOPHAGE) 1000 MG tablet   Misc Natural Products (URINOZINC PROSTATE) CAPS   nitroGLYCERIN (NITROSTAT) 0.4 MG SL tablet   valsartan (DIOVAN) 320 MG tablet    Myra Gianotti, PA-C Surgical Short Stay/Anesthesiology [facility] Phone [phone] [facility] Phone [phone] 02/04/2022 2:34 PM

## 2022-02-05 ENCOUNTER — Encounter (HOSPITAL_COMMUNITY): Payer: Self-pay | Admitting: Orthopedic Surgery

## 2022-02-05 ENCOUNTER — Ambulatory Visit (HOSPITAL_COMMUNITY): Payer: Medicare Other | Admitting: Vascular Surgery

## 2022-02-05 ENCOUNTER — Encounter (HOSPITAL_COMMUNITY)
Admission: RE | Disposition: A | Discharge status: Home / Self Care | Payer: Self-pay | Source: Home / Self Care | Attending: Orthopedic Surgery

## 2022-02-05 ENCOUNTER — Ambulatory Visit (HOSPITAL_BASED_OUTPATIENT_CLINIC_OR_DEPARTMENT_OTHER): Payer: Medicare Other | Admitting: Vascular Surgery

## 2022-02-05 ENCOUNTER — Ambulatory Visit (HOSPITAL_COMMUNITY): Payer: Medicare Other

## 2022-02-05 ENCOUNTER — Ambulatory Visit (HOSPITAL_COMMUNITY)
Admission: RE | Admit: 2022-02-05 | Discharge: 2022-02-05 | Disposition: A | Discharge status: Home / Self Care | Payer: Medicare Other | Attending: Orthopedic Surgery | Admitting: Orthopedic Surgery

## 2022-02-05 DIAGNOSIS — Z8673 Personal history of transient ischemic attack (TIA), and cerebral infarction without residual deficits: Secondary | ICD-10-CM | POA: Insufficient documentation

## 2022-02-05 DIAGNOSIS — I129 Hypertensive chronic kidney disease with stage 1 through stage 4 chronic kidney disease, or unspecified chronic kidney disease: Secondary | ICD-10-CM

## 2022-02-05 DIAGNOSIS — S63311A Traumatic rupture of collateral ligament of right wrist, initial encounter: Secondary | ICD-10-CM

## 2022-02-05 DIAGNOSIS — Z794 Long term (current) use of insulin: Secondary | ICD-10-CM | POA: Diagnosis not present

## 2022-02-05 DIAGNOSIS — Z955 Presence of coronary angioplasty implant and graft: Secondary | ICD-10-CM | POA: Insufficient documentation

## 2022-02-05 DIAGNOSIS — M25331 Other instability, right wrist: Secondary | ICD-10-CM

## 2022-02-05 DIAGNOSIS — N189 Chronic kidney disease, unspecified: Secondary | ICD-10-CM

## 2022-02-05 DIAGNOSIS — Z7984 Long term (current) use of oral hypoglycemic drugs: Secondary | ICD-10-CM | POA: Insufficient documentation

## 2022-02-05 DIAGNOSIS — E1122 Type 2 diabetes mellitus with diabetic chronic kidney disease: Secondary | ICD-10-CM | POA: Diagnosis not present

## 2022-02-05 DIAGNOSIS — I2510 Atherosclerotic heart disease of native coronary artery without angina pectoris: Secondary | ICD-10-CM | POA: Diagnosis not present

## 2022-02-05 DIAGNOSIS — I252 Old myocardial infarction: Secondary | ICD-10-CM | POA: Diagnosis not present

## 2022-02-05 DIAGNOSIS — M25341 Other instability, right hand: Secondary | ICD-10-CM | POA: Insufficient documentation

## 2022-02-05 HISTORY — DX: Cardiac murmur, unspecified: R01.1

## 2022-02-05 LAB — BASIC METABOLIC PANEL
Anion gap: 8 (ref 5–15)
BUN: 23 mg/dL (ref 8–23)
CO2: 26 mmol/L (ref 22–32)
Calcium: 9.6 mg/dL (ref 8.9–10.3)
Chloride: 106 mmol/L (ref 98–111)
Creatinine, Ser: 1.26 mg/dL — ABNORMAL HIGH (ref 0.61–1.24)
GFR, Estimated: >60 mL/min (ref >60)
Glucose, Bld: 111 mg/dL — ABNORMAL HIGH (ref 70–99)
Potassium: 3.6 mmol/L (ref 3.5–5.1)
Sodium: 140 mmol/L (ref 135–145)

## 2022-02-05 LAB — GLUCOSE, CAPILLARY
Glucose-Capillary: 106 mg/dL — ABNORMAL HIGH (ref 70–99)
Glucose-Capillary: 106 mg/dL — ABNORMAL HIGH (ref 70–99)

## 2022-02-05 LAB — CBC
HCT: 43.4 % (ref 39.0–52.0)
Hemoglobin: 15.2 g/dL (ref 13.0–17.0)
MCH: 31 pg (ref 26.0–34.0)
MCHC: 35 g/dL (ref 30.0–36.0)
MCV: 88.6 fL (ref 80.0–100.0)
Platelets: 261 10*3/uL (ref 150–400)
RBC: 4.9 MIL/uL (ref 4.22–5.81)
RDW: 13 % (ref 11.5–15.5)
WBC: 6.1 10*3/uL (ref 4.0–10.5)
nRBC: 0 % (ref 0.0–0.2)

## 2022-02-05 SURGERY — RECONSTRUCTION, LIGAMENT, COLLATERAL, MCP JOINT
Anesthesia: Regional | Site: Thumb | Laterality: Right

## 2022-02-05 MED ORDER — LACTATED RINGERS IV SOLN: INTRAVENOUS | Status: DC

## 2022-02-05 MED ORDER — PROPOFOL 10 MG/ML IV BOLUS
INTRAVENOUS | Status: DC | PRN
  Administered 2022-02-05: 50 mg via INTRAVENOUS

## 2022-02-05 MED ORDER — OXYCODONE HCL 5 MG PO TABS: 5.0000 mg | ORAL_TABLET | ORAL | 0 refills | Status: AC | PRN

## 2022-02-05 MED ORDER — FENTANYL CITRATE (PF) 100 MCG/2ML IJ SOLN
INTRAMUSCULAR | Status: AC
  Administered 2022-02-05: 50 ug
  Filled 2022-02-05: qty 2

## 2022-02-05 MED ORDER — LIDOCAINE 2% (20 MG/ML) 5 ML SYRINGE
INTRAMUSCULAR | Status: AC
  Filled 2022-02-05: qty 5

## 2022-02-05 MED ORDER — CHLORHEXIDINE GLUCONATE 0.12 % MT SOLN
OROMUCOSAL | Status: AC
  Administered 2022-02-05: 15 mL via OROMUCOSAL
  Filled 2022-02-05: qty 15

## 2022-02-05 MED ORDER — PHENYLEPHRINE HCL-NACL 20-0.9 MG/250ML-% IV SOLN
INTRAVENOUS | Status: DC | PRN
  Administered 2022-02-05: 25 ug/min via INTRAVENOUS

## 2022-02-05 MED ORDER — FENTANYL CITRATE (PF) 100 MCG/2ML IJ SOLN: 50.0000 ug | Freq: Once | INTRAMUSCULAR | Status: AC

## 2022-02-05 MED ORDER — EPHEDRINE 5 MG/ML INJ
INTRAVENOUS | Status: AC
  Filled 2022-02-05: qty 5

## 2022-02-05 MED ORDER — ONDANSETRON HCL 4 MG/2ML IJ SOLN
INTRAMUSCULAR | Status: AC
  Filled 2022-02-05: qty 2

## 2022-02-05 MED ORDER — LIDOCAINE 2% (20 MG/ML) 5 ML SYRINGE
INTRAMUSCULAR | Status: DC | PRN
  Administered 2022-02-05: 20 mg via INTRAVENOUS

## 2022-02-05 MED ORDER — ACETAMINOPHEN 500 MG PO TABS
1000.0000 mg | ORAL_TABLET | Freq: Once | ORAL | Status: AC
Start: 2022-02-05 — End: 2022-02-05
  Administered 2022-02-05: 1000 mg via ORAL
  Filled 2022-02-05: qty 2

## 2022-02-05 MED ORDER — CHLORHEXIDINE GLUCONATE 0.12 % MT SOLN
15.0000 mL | OROMUCOSAL | Status: AC
  Filled 2022-02-05: qty 15

## 2022-02-05 MED ORDER — CEFAZOLIN SODIUM-DEXTROSE 2-4 GM/100ML-% IV SOLN
2.0000 g | INTRAVENOUS | Status: AC
  Administered 2022-02-05: 2 g via INTRAVENOUS
  Filled 2022-02-05: qty 100

## 2022-02-05 MED ORDER — METHOCARBAMOL 500 MG PO TABS: 500.0000 mg | ORAL_TABLET | Freq: Four times a day (QID) | ORAL | 0 refills | Status: DC | PRN

## 2022-02-05 MED ORDER — BUPIVACAINE-EPINEPHRINE (PF) 0.5% -1:200000 IJ SOLN
INTRAMUSCULAR | Status: DC | PRN
  Administered 2022-02-05: 30 mL via PERINEURAL

## 2022-02-05 MED ORDER — PROPOFOL 500 MG/50ML IV EMUL
INTRAVENOUS | Status: DC | PRN
  Administered 2022-02-05: 100 ug/kg/min via INTRAVENOUS

## 2022-02-05 MED ORDER — MIDAZOLAM HCL 2 MG/2ML IJ SOLN
INTRAMUSCULAR | Status: AC
  Filled 2022-02-05: qty 2

## 2022-02-05 MED ORDER — INSULIN ASPART 100 UNIT/ML IJ SOLN: 0.0000 [IU] | INTRAMUSCULAR | Status: DC | PRN

## 2022-02-05 MED ORDER — EPHEDRINE SULFATE-NACL 50-0.9 MG/10ML-% IV SOSY
PREFILLED_SYRINGE | INTRAVENOUS | Status: DC | PRN
  Administered 2022-02-05 (×2): 5 mg via INTRAVENOUS

## 2022-02-05 MED ORDER — CEPHALEXIN 500 MG PO CAPS: 500.0000 mg | ORAL_CAPSULE | Freq: Four times a day (QID) | ORAL | 0 refills | Status: AC

## 2022-02-05 MED ORDER — 0.9 % SODIUM CHLORIDE (POUR BTL) OPTIME
TOPICAL | Status: DC | PRN
  Administered 2022-02-05: 1000 mL

## 2022-02-05 MED ORDER — ONDANSETRON HCL 4 MG/2ML IJ SOLN
INTRAMUSCULAR | Status: DC | PRN
  Administered 2022-02-05: 4 mg via INTRAVENOUS

## 2022-02-05 SURGICAL SUPPLY — 48 items
ANCHOR REPAIR HAND WRIST (Anchor) IMPLANT
BNDG COHESIVE 1X5 TAN STRL LF (GAUZE/BANDAGES/DRESSINGS) IMPLANT
BNDG ELASTIC 3X5.8 VLCR STR LF (GAUZE/BANDAGES/DRESSINGS) IMPLANT
BNDG ELASTIC 4X5.8 VLCR STR LF (GAUZE/BANDAGES/DRESSINGS) IMPLANT
BNDG GAUZE DERMACEA FLUFF 4 (GAUZE/BANDAGES/DRESSINGS) ×2 IMPLANT
CORD BIPOLAR FORCEPS 12FT (ELECTRODE) ×1 IMPLANT
COVER SURGICAL LIGHT HANDLE (MISCELLANEOUS) ×1 IMPLANT
CUFF TOURN SGL QUICK 24 (TOURNIQUET CUFF) ×1
CUFF TRNQT CYL 24X4X16.5-23 (TOURNIQUET CUFF) IMPLANT
DRAPE SURG 17X23 STRL (DRAPES) ×1 IMPLANT
DRSG ADAPTIC 3X8 NADH LF (GAUZE/BANDAGES/DRESSINGS) IMPLANT
GAUZE SPONGE 2X2 8PLY STRL LF (GAUZE/BANDAGES/DRESSINGS) IMPLANT
GAUZE SPONGE 4X4 12PLY STRL (GAUZE/BANDAGES/DRESSINGS) IMPLANT
GAUZE XEROFORM 1X8 LF (GAUZE/BANDAGES/DRESSINGS) IMPLANT
GAUZE XEROFORM 5X9 LF (GAUZE/BANDAGES/DRESSINGS) IMPLANT
GLOVE BIOGEL M 8.0 STRL (GLOVE) ×1 IMPLANT
GLOVE SS BIOGEL STRL SZ 8 (GLOVE) ×1 IMPLANT
GOWN STRL REUS W/ TWL LRG LVL3 (GOWN DISPOSABLE) ×2 IMPLANT
GOWN STRL REUS W/ TWL XL LVL3 (GOWN DISPOSABLE) ×3 IMPLANT
GOWN STRL REUS W/TWL LRG LVL3 (GOWN DISPOSABLE) ×2
GOWN STRL REUS W/TWL XL LVL3 (GOWN DISPOSABLE) ×3
KIT BASIN OR (CUSTOM PROCEDURE TRAY) ×1 IMPLANT
KIT TURNOVER KIT B (KITS) ×1 IMPLANT
MANIFOLD NEPTUNE II (INSTRUMENTS) ×1 IMPLANT
NS IRRIG 1000ML POUR BTL (IV SOLUTION) ×1 IMPLANT
PACK ORTHO EXTREMITY (CUSTOM PROCEDURE TRAY) ×1 IMPLANT
PAD ARMBOARD 7.5X6 YLW CONV (MISCELLANEOUS) ×2 IMPLANT
PAD CAST 4YDX4 CTTN HI CHSV (CAST SUPPLIES) IMPLANT
PADDING CAST ABS COTTON 3X4 (CAST SUPPLIES) IMPLANT
PADDING CAST ABS COTTON 4X4 ST (CAST SUPPLIES) IMPLANT
PADDING CAST COTTON 4X4 STRL (CAST SUPPLIES)
PILLOW ARM CARTER ADULT (MISCELLANEOUS) IMPLANT
SLING ARM IMMOBILIZER LRG (SOFTGOODS) IMPLANT
SOL PREP POV-IOD 4OZ 10% (MISCELLANEOUS) ×3 IMPLANT
SPECIMEN JAR SMALL (MISCELLANEOUS) ×1 IMPLANT
SPIKE FLUID TRANSFER (MISCELLANEOUS) ×1 IMPLANT
SUCTION FRAZIER HANDLE 10FR (MISCELLANEOUS)
SUCTION TUBE FRAZIER 10FR DISP (MISCELLANEOUS) IMPLANT
SUT MERSILENE 4 0 P 3 (SUTURE) IMPLANT
SUT PROLENE 4 0 PS 2 18 (SUTURE) IMPLANT
SUT VIC AB 2-0 CT1 27 (SUTURE)
SUT VIC AB 2-0 CT1 TAPERPNT 27 (SUTURE) IMPLANT
SYR CONTROL 10ML LL (SYRINGE) IMPLANT
TOWEL GREEN STERILE (TOWEL DISPOSABLE) ×1 IMPLANT
TOWEL GREEN STERILE FF (TOWEL DISPOSABLE) ×1 IMPLANT
TUBE CONNECTING 12X1/4 (SUCTIONS) IMPLANT
UNDERPAD 30X36 HEAVY ABSORB (UNDERPADS AND DIAPERS) ×1 IMPLANT
WATER STERILE IRR 1000ML POUR (IV SOLUTION) ×1 IMPLANT

## 2022-02-05 NOTE — Anesthesia Procedure Notes (Signed)
Procedure Name: MAC Date/Time: 02/05/2022 4:00 PM  Performed by: Reece Agar, CRNAPre-anesthesia Checklist: Patient identified, Emergency Drugs available, Suction available and Patient being monitored Patient Re-evaluated:Patient Re-evaluated prior to induction Oxygen Delivery Method: Simple face mask

## 2022-02-05 NOTE — H&P (Signed)
Wayne Marshall is an 71 y.o. male.   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 The patient denies neck, back, chest or  abdominal pain. The patient notes that they have no lower extremity problems. The patients primary complaint is noted. We are planning surgical care pathway for the upper extremity.   Past Medical History:  Diagnosis Date   Acute respiratory failure with hypoxia ([place]) 06/30/2019   AKI (acute kidney injury) ([place]) 06/30/2019   CAD (coronary artery disease) 06/29/2020   Chronic kidney disease    COVID-19 2021   DM2 (diabetes mellitus, type 2) ([place]) 06/30/2019   Heart murmur    trace-mild AI 03/14/21 echo   HTN (hypertension) 06/30/2019   Hypoxemia    Impingement syndrome of left shoulder region 11/12/2017   Mixed dyslipidemia 12/01/2020   Pneumonia due to COVID-19 virus 06/30/2019   STEMI involving right coronary artery ([place]) 06/19/2020   Stroke ([place]) 04/19/2011    Past Surgical History:  Procedure Laterality Date   CORONARY STENT INTERVENTION N/A 06/19/2020   Procedure: CORONARY STENT INTERVENTION;  Surgeon: Nelva Bush, MD;  Location: [place] [facility];  Service: Cardiovascular;  Laterality: N/A;   CORONARY STENT INTERVENTION N/A 06/21/2020   Procedure: CORONARY STENT INTERVENTION;  Surgeon: Wellington Hampshire, MD;  Location: [place] [facility] LAB;  Service: Cardiovascular;  Laterality: N/A;   INTRAVASCULAR ULTRASOUND/IVUS N/A 06/21/2020   Procedure: Intravascular Ultrasound/IVUS;  Surgeon: Wellington Hampshire, MD;  Location: [place] [facility] LAB;  Service: Cardiovascular;  Laterality: N/A;   LEFT HEART CATH AND CORONARY ANGIOGRAPHY N/A 06/19/2020   Procedure: LEFT HEART CATH AND CORONARY ANGIOGRAPHY;  Surgeon: Nelva Bush, MD;  Location: [place] [facility]  LAB;  Service: Cardiovascular;  Laterality: N/A;   SHOULDER ARTHROSCOPY Left    Dr Onnie Graham    Family History  Problem Relation Age of Onset   Cancer Father    Heart disease Neg Hx    Social History:  reports that he has never smoked. He has never used smokeless tobacco. He reports that he does not currently use drugs after having used the following drugs: Marijuana. He reports that he does not drink alcohol.  Allergies: No Known Allergies  Medications Prior to Admission  Medication Sig Dispense Refill   aspirin 81 MG chewable tablet Chew 1 tablet (81 mg total) by mouth daily. 90 tablet 1   atorvastatin (LIPITOR) 80 MG tablet Take 1 tablet (80 mg total) by mouth daily. (Patient taking differently: Take 40 mg by mouth daily.) 90 tablet 1   carvedilol (COREG) 6.25 MG tablet Take 6.25 mg by mouth 2 (two) times daily with a meal.     clopidogrel (PLAVIX) 75 MG tablet Take 1 tablet (75 mg total) by mouth daily. 90 tablet 3   hydrALAZINE (APRESOLINE) 10 MG tablet Take 20 mg by mouth 3 (three) times daily.     hydrochlorothiazide (HYDRODIURIL) 25 MG tablet Take 25 mg by mouth daily.     metFORMIN (GLUCOPHAGE) 1000 MG tablet Take 1,000 mg by mouth 2 (two) times daily with a meal.     Misc Natural Products (URINOZINC PROSTATE) CAPS Take 1 capsule by mouth daily.     nitroGLYCERIN (NITROSTAT) 0.4 MG SL tablet Place 0.4 mg under the tongue every 5 (five) minutes as needed for chest pain.     valsartan (DIOVAN)  320 MG tablet Take 160 mg by mouth daily.      Results for orders placed or performed during the hospital encounter of 02/05/22 (from the past 48 hour(s))  Glucose, capillary     Status: Abnormal   Collection Time: 02/05/22  1:11 PM  Result Value Ref Range   Glucose-Capillary 106 (H) 70 - 99 mg/dL    Comment: Glucose reference range applies only to samples taken after fasting for at least 8 hours.  CBC     Status: None   Collection Time: 02/05/22  1:40 PM  Result Value Ref Range   WBC  6.1 4.0 - 10.5 K/uL   RBC 4.90 4.22 - 5.81 MIL/uL   Hemoglobin 15.2 13.0 - 17.0 g/dL   HCT 43.4 39.0 - 52.0 %   MCV 88.6 80.0 - 100.0 fL   MCH 31.0 26.0 - 34.0 pg   MCHC 35.0 30.0 - 36.0 g/dL   RDW 13.0 11.5 - 15.5 %   Platelets 261 150 - 400 K/uL   nRBC 0.0 0.0 - 0.2 %    Comment: Performed at [facility], [place] [address] [place], [place] [id number]  Basic metabolic panel     Status: Abnormal   Collection Time: 02/05/22  1:40 PM  Result Value Ref Range   Sodium 140 135 - 145 mmol/L   Potassium 3.6 3.5 - 5.1 mmol/L   Chloride 106 98 - 111 mmol/L   CO2 26 22 - 32 mmol/L   Glucose, Bld 111 (H) 70 - 99 mg/dL    Comment: Glucose reference range applies only to samples taken after fasting for at least 8 hours.   BUN 23 8 - 23 mg/dL   Creatinine, Ser 1.26 (H) 0.61 - 1.24 mg/dL   Calcium 9.6 8.9 - 10.3 mg/dL   GFR, Estimated >60 >60 mL/min    Comment: (NOTE) Calculated using the CKD-EPI Creatinine Equation (2021)    Anion gap 8 5 - 15    Comment: Performed at [facility] [address] [place], [place] [address]   No results found.  Review of Systems  Respiratory: Negative.    Cardiovascular: Negative.   Gastrointestinal: Negative.   Genitourinary: Negative.     Blood pressure (!) 189/93, pulse (!) 52, temperature 97.9 F (36.6 C), resp. rate 12, height 5\' 9"  (1.753 m), weight 79.4 kg, SpO2 100 %. 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  The patient is alert and oriented in no acute distress. The patient complains of pain in the affected upper extremity.  The patient is noted to have a normal HEENT exam. Lung fields show equal chest expansion and no shortness of breath. Abdomen exam is nontender without distention. Lower extremity examination does not show any fracture dislocation or blood clot symptoms. Pelvis is stable and the neck and back are stable and nontender.    Assessment/Plan We will plan reconstruction right thumb MCP joint with tendon graft and repair reconstruction is necessary.  With his operative approach we will remove the small skin tag about the palmaris longus harvest site.  We are planning surgery for your upper extremity. The risk and benefits of surgery to include risk of bleeding, infection, anesthesia,  damage to normal structures and failure of the surgery to accomplish its intended goals of relieving symptoms and restoring function have been discussed in detail. With this in mind we plan to proceed. I have specifically discussed with the  patient the pre-and postoperative regime and the dos and don'ts and risk and benefits in great detail. Risk and benefits of surgery also include risk of dystrophy(CRPS), chronic nerve pain, failure of the healing process to go onto completion and other inherent risks of surgery The relavent the pathophysiology of the disease/injury process, as well as the alternatives for treatment and postoperative course of action has been discussed in great detail with the patient who desires to proceed.  We will do everything in our power to help you (the patient) restore function to the upper extremity. It is a pleasure to see this patient today.   Willa Frater III, MD 02/05/2022, 3:42 PM

## 2022-02-05 NOTE — Discharge Instructions (Signed)

## 2022-02-05 NOTE — Op Note (Signed)
Operative note 02/05/2022.  Roseanne Kaufman, MD.  Date of surgery and dictation 02/05/2022.  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 with pain and dysfunction  Postop diagnosis the same  Operative procedure: #1 right thumb metacarpal phalangeal joint radial collateral ligament reconstruction with autologous tendon graft utilizing the palmaris and internal brace via Arthrex repair system #2 stress radiography right thumb #3 radial division of the superficial radial nerve neuroplasty  Surgeon Roseanne Kaufman   Anesthesia Block with IV sedation  Estimated blood loss minimal  Tourniquet time less than an hour  Procedure in detail: Patient was taken to the procedural suite he was prepped and draped with Hibiclens scrub followed by Betadine scrub and paint.  Timeout was observed.  The patient had an excellent block and was sedated lightly.  He was counseled and all checkpoints were correct.  The operation commenced with a radial incision after identifying the gross instability.  Dissection was carried down and we very carefully and cautiously open the interval about the deep fascia.  The radial collateral ligament was completely ruptured.  At this time I placed guidewires for the Arthrex suspension system and checked this under x-ray.  Informing the approach I did perform a radial division of the superficial radial nerve neurolysis/neuroplasty due to scar tissue from the chronic injury.  The joint was stable.  He had some early wear features but nothing and stage.  At this time I then harvested a 2 mm portion of the palmaris without difficulty.  There were no complicating features.  Fiber suture was placed on either side of the graft.  These areas were irrigated and closed with Prolene about the volar forearm.  Following this we then drilled the proximal phalanx and metacarpal head region.  I placed the proximal phalanx drill  via guidewire technique just below the equator and the insertion of the graft and suture tape went without difficulty.  I then set appropriate tension and placed the thumb in 30 degrees and in a reduced position followed by placement of the 3.5 swivel lock in the metacarpal.  This allowed for a autologous tendon graft and internal brace with fiber tape support.  This was a very solid construct.  This completely alleviated the instability and I was quite pleased with this.  I then oversewed additional graft that was in excess and following this closed the capsule with FiberWire and the interval about the abductor insertion with FiberWire.  The area was irrigated and closed with the tourniquet deflated and compartments soft.  The patient tolerated this well there were no complicating features.  All sponge needle and instrument counts were reported as correct.  After splint was applied I discussed all issues with his family.  He will be discharged home on oxycodone Robaxin and Keflex.  We will see him in 2 weeks and place him in a removable brace at that time.  Should any problems occur he will notify us.  I want to protect him for up to 8 weeks but will begin motion at 4 weeks with flexion and extension gently.  He has been a pleasure to participate in his care.  Markeria Goetsch MD

## 2022-02-05 NOTE — Anesthesia Procedure Notes (Signed)
Anesthesia Regional Block: Axillary brachial plexus block   Pre-Anesthetic Checklist: , timeout performed,  Correct Patient, Correct Site, Correct Laterality,  Correct Procedure, Correct Position, site marked,  Risks and benefits discussed,  Surgical consent,  Pre-op evaluation,  At surgeon's request and post-op pain management  Laterality: Right  Prep: chloraprep       Needles:  Injection technique: Single-shot  Needle Type: Echogenic Needle     Needle Length: 5cm  Needle Gauge: 21     Additional Needles:   Narrative:  Start time: 02/05/2022 3:20 PM End time: 02/05/2022 3:23 PM Injection made incrementally with aspirations every 5 mL.  Performed by: Personally  Anesthesiologist: Audry Pili, MD  Additional Notes: No pain on injection. No increased resistance to injection. Injection made in 5cc increments. Good needle visualization. Patient tolerated the procedure well.

## 2022-02-05 NOTE — Transfer of Care (Signed)
Immediate Anesthesia Transfer of Care Note  Patient: Wayne Marshall  Procedure(s) Performed: Right thumb ligamentous reconstruction radial collateral ligament with tendon graft and repair is necessary (Right: Thumb)  Patient Location: PACU  Anesthesia Type:MAC combined with regional for post-op pain  Level of Consciousness: drowsy  Airway & Oxygen Therapy: Patient Spontanous Breathing and Patient connected to face mask oxygen  Post-op Assessment: Report given to RN and Post -op Vital signs reviewed and stable  Post vital signs: Reviewed and stable  Last Vitals:  Vitals Value Taken Time  BP 116/71 02/05/22 17:35  Temp    Pulse 62 02/05/22 17:35  Resp 18 02/05/22 17:35  SpO2 97 02/05/22 17:35    Last Pain:  Vitals:   02/05/22 1523  PainSc: 0-No pain      Patients Stated Pain Goal: 0 (19/75/88 3254)  Complications: No notable events documented.

## 2022-02-06 DIAGNOSIS — R011 Cardiac murmur, unspecified: Secondary | ICD-10-CM | POA: Insufficient documentation

## 2022-02-06 NOTE — Anesthesia Postprocedure Evaluation (Signed)
Anesthesia Post Note  Patient: Wayne Marshall  Procedure(s) Performed: Right thumb ligamentous reconstruction radial collateral ligament with tendon graft and repair (Right: Thumb)     Patient location during evaluation: PACU Anesthesia Type: Regional Level of consciousness: awake and alert Pain management: pain level controlled Vital Signs Assessment: post-procedure vital signs reviewed and stable Respiratory status: spontaneous breathing, nonlabored ventilation and respiratory function stable Cardiovascular status: stable and blood pressure returned to baseline Anesthetic complications: no   No notable events documented.  Last Vitals:  Vitals:   02/05/22 1730 02/05/22 1745  BP: 116/71 139/81  Pulse: 60 (!) 58  Resp: 11 12  Temp: (!) 36.1 C (!) 36.4 C  SpO2: 98% 98%    Last Pain:  Vitals:   02/05/22 1745  PainSc: 0-No pain                 Audry Pili

## 2022-02-07 ENCOUNTER — Other Ambulatory Visit: Payer: Self-pay

## 2022-02-09 DIAGNOSIS — E1122 Type 2 diabetes mellitus with diabetic chronic kidney disease: Secondary | ICD-10-CM | POA: Diagnosis not present

## 2022-02-09 DIAGNOSIS — I10 Essential (primary) hypertension: Secondary | ICD-10-CM | POA: Diagnosis not present

## 2022-02-10 DIAGNOSIS — E1122 Type 2 diabetes mellitus with diabetic chronic kidney disease: Secondary | ICD-10-CM | POA: Diagnosis not present

## 2022-02-10 DIAGNOSIS — E782 Mixed hyperlipidemia: Secondary | ICD-10-CM | POA: Diagnosis not present

## 2022-02-13 ENCOUNTER — Ambulatory Visit: Payer: Medicare Other | Attending: Cardiology | Admitting: Cardiology

## 2022-02-13 ENCOUNTER — Encounter: Payer: Self-pay | Admitting: Cardiology

## 2022-02-13 VITALS — BP 134/78 | HR 80 | Ht 69.0 in | Wt 166.4 lb

## 2022-02-13 DIAGNOSIS — E1100 Type 2 diabetes mellitus with hyperosmolarity without nonketotic hyperglycemic-hyperosmolar coma (NKHHC): Secondary | ICD-10-CM | POA: Diagnosis not present

## 2022-02-13 DIAGNOSIS — I2510 Atherosclerotic heart disease of native coronary artery without angina pectoris: Secondary | ICD-10-CM | POA: Diagnosis not present

## 2022-02-13 DIAGNOSIS — E782 Mixed hyperlipidemia: Secondary | ICD-10-CM

## 2022-02-13 DIAGNOSIS — N189 Chronic kidney disease, unspecified: Secondary | ICD-10-CM | POA: Diagnosis not present

## 2022-02-13 NOTE — Progress Notes (Signed)
Cardiology Office Note:    Date:  02/13/2022   ID:  Wayne Marshall, DOB 07-16-1950, MRN [id number]  PCP:  Maryella Shivers, MD  Cardiologist:  Jenean Lindau, MD   Referring MD: Marco Collie, MD    ASSESSMENT:    1. Coronary artery disease involving native coronary artery of native heart without angina pectoris   2. Type 2 diabetes mellitus with hyperosmolarity without coma, without long-term current use of insulin ([place])   3. Chronic kidney disease, unspecified CKD stage   4. Mixed dyslipidemia    PLAN:    In order of problems listed above:  Coronary artery disease: Secondary prevention stressed with the patient.  Importance of compliance with diet medication stressed and he vocalized understanding.  He was advised to walk at least half an hour a day 5 days a week and he promises to do so. Essential hypertension: Blood pressure is stable and diet was emphasized.  Lifestyle modification urged. Mixed dyslipidemia: On lipid-lowering therapy managed by primary care.  Lipids were reviewed from [facility] sheet and discussed with the patient. Diabetes mellitus: Managed by primary care.  Diet emphasized.  He also has renal insufficiency history and this is managed by primary care too. Patient will be seen in follow-up appointment in 9 months or earlier if the patient has any concerns    Medication Adjustments/Labs and Tests Ordered: Current medicines are reviewed at length with the patient today.  Concerns regarding medicines are outlined above.  No orders of the defined types were placed in this encounter.  No orders of the defined types were placed in this encounter.    No chief complaint on file.    History of Present Illness:    Wayne Marshall is a 71 y.o. male.  Patient has past medical history of coronary artery disease post stenting, essential hypertension, mixed dyslipidemia and diabetes mellitus.  He denies any problems at this time and takes care of activities of daily  living.  He has had hand injury and undergone recent surgery and he tells me that it went well.  He is recovering from it.  His hand is in a soft cast.  At the time of my evaluation, the patient is alert awake oriented and in no distress.  Past Medical History:  Diagnosis Date   Acute respiratory failure with hypoxia ([place]) 06/30/2019   AKI (acute kidney injury) ([place]) 06/30/2019   CAD (coronary artery disease) 06/29/2020   Chronic kidney disease    CKD stage 3 secondary to diabetes ([place]) 06/30/2019   Contusion of right thumb 08/27/2021   COVID-19 2021   Diabetes mellitus without complication ([place])    DM2 (diabetes mellitus, type 2) ([place]) 06/30/2019   Heart murmur    trace-mild AI 03/14/21 echo   HTN (hypertension) 06/30/2019   Hypertension    Hypoxemia    Impingement syndrome of left shoulder region 11/12/2017   Injury of ligament of hand 08/27/2021   Mixed dyslipidemia 12/01/2020   Pneumonia due to COVID-19 virus 06/30/2019   STEMI (ST elevation myocardial infarction) ([place]) 06/19/2020   STEMI involving right coronary artery ([place]) 06/19/2020   Stroke ([place]) 04/19/2011    Past Surgical History:  Procedure Laterality Date   CORONARY STENT INTERVENTION N/A 06/19/2020   Procedure: CORONARY STENT INTERVENTION;  Surgeon: Nelva Bush, MD;  Location: [place] [facility];  Service: Cardiovascular;  Laterality: N/A;   CORONARY STENT INTERVENTION N/A 06/21/2020   Procedure: CORONARY STENT INTERVENTION;  Surgeon: Wellington Hampshire, MD;  Location: [place] [facility];  Service: Cardiovascular;  Laterality: N/A;   INTRAVASCULAR ULTRASOUND/IVUS N/A 06/21/2020   Procedure: Intravascular Ultrasound/IVUS;  Surgeon: Wellington Hampshire, MD;  Location: [place] [facility];  Service: Cardiovascular;  Laterality: N/A;   LEFT HEART CATH AND CORONARY ANGIOGRAPHY N/A 06/19/2020   Procedure: LEFT HEART CATH AND CORONARY ANGIOGRAPHY;  Surgeon: Nelva Bush, MD;  Location: [place] [facility];  Service:  Cardiovascular;  Laterality: N/A;   SHOULDER ARTHROSCOPY Left    Dr Onnie Graham    Current Medications: Current Meds  Medication Sig   aspirin 81 MG chewable tablet Chew 1 tablet (81 mg total) by mouth daily.   atorvastatin (LIPITOR) 80 MG tablet Take 1 tablet (80 mg total) by mouth daily.   carvedilol (COREG) 6.25 MG tablet Take 6.25 mg by mouth 2 (two) times daily with a meal.   clopidogrel (PLAVIX) 75 MG tablet Take 1 tablet (75 mg total) by mouth daily.   hydrALAZINE (APRESOLINE) 10 MG tablet Take 20 mg by mouth 3 (three) times daily.   hydrochlorothiazide (HYDRODIURIL) 25 MG tablet Take 25 mg by mouth daily.   metFORMIN (GLUCOPHAGE) 1000 MG tablet Take 1,000 mg by mouth 2 (two) times daily with a meal.   Misc Natural Products (URINOZINC PROSTATE) CAPS Take 1 capsule by mouth daily.   nitroGLYCERIN (NITROSTAT) 0.4 MG SL tablet Place 0.4 mg under the tongue every 5 (five) minutes as needed for chest pain.   valsartan (DIOVAN) 320 MG tablet Take 160 mg by mouth daily.     Allergies:   Patient has no known allergies.   Social History   Socioeconomic History   Marital status: Married    Spouse name: margie Frieden   Number of children: Not on file   Years of education: Not on file   Highest education level: Not on file  Occupational History   Not on file  Tobacco Use   Smoking status: Never   Smokeless tobacco: Never  Vaping Use   Vaping Use: Never used  Substance and Sexual Activity   Alcohol use: Never   Drug use: Not Currently    Types: Marijuana    Comment: in college   Sexual activity: Never  Other Topics Concern   Not on file  Social History Narrative   Not on file   Social Determinants of Health   Financial Resource Strain: Not on file  Food Insecurity: Not on file  Transportation Needs: Not on file  Physical Activity: Not on file  Stress: Not on file  Social Connections: Not on file     Family History: The patient's family history includes Cancer in his  father. There is no history of Heart disease.  ROS:   Please see the history of present illness.    All other systems reviewed and are negative.  EKGs/Labs/Other Studies Reviewed:    The following studies were reviewed today: I discussed my findings with the patient at length   Recent Labs: 02/05/2022: BUN 23; Creatinine, Ser 1.26; Hemoglobin 15.2; Platelets 261; Potassium 3.6; Sodium 140  Recent Lipid Panel    Component Value Date/Time   CHOL 145 11/30/2020 0910   TRIG 60 11/30/2020 0910   HDL 45 11/30/2020 0910   CHOLHDL 3.2 11/30/2020 0910   CHOLHDL 3.4 06/19/2020 1630   VLDL 22 06/19/2020 1630   LDLCALC 88 11/30/2020 0910    Physical Exam:    VS:  BP 134/78   Pulse 80   Ht 5\' 9"  (1.753 m)  Wt 166 lb 6.4 oz (75.5 kg)   SpO2 99%   BMI 24.57 kg/m     Wt Readings from Last 3 Encounters:  02/13/22 166 lb 6.4 oz (75.5 kg)  02/05/22 175 lb (79.4 kg)  05/30/21 170 lb 9.6 oz (77.4 kg)     GEN: Patient is in no acute distress HEENT: Normal NECK: No JVD; No carotid bruits LYMPHATICS: No lymphadenopathy CARDIAC: Hear sounds regular, 2/6 systolic murmur at the apex. RESPIRATORY:  Clear to auscultation without rales, wheezing or rhonchi  ABDOMEN: Soft, non-tender, non-distended MUSCULOSKELETAL:  No edema; No deformity  SKIN: Warm and dry NEUROLOGIC:  Alert and oriented x 3 PSYCHIATRIC:  Normal affect   Signed, Jenean Lindau, MD  02/13/2022 10:47 AM    [place]

## 2022-02-13 NOTE — Patient Instructions (Signed)
Medication Instructions:  Your physician has recommended you make the following change in your medication:   Stop aspirin  *If you need a refill on your cardiac medications before your next appointment, please call your pharmacy*   Lab Work: None ordered If you have labs (blood work) drawn today and your tests are completely normal, you will receive your results only by: MyChart Message (if you have MyChart) OR A paper copy in the mail If you have any lab test that is abnormal or we need to change your treatment, we will call you to review the results.   Testing/Procedures: None ordered   Follow-Up: At [facility], you and your health needs are our priority.  As part of our continuing mission to provide you with exceptional heart care, we have created designated Provider Care Teams.  These Care Teams include your primary Cardiologist (physician) and Advanced Practice Providers (APPs -  Physician Assistants and Nurse Practitioners) who all work together to provide you with the care you need, when you need it.  We recommend signing up for the patient portal called "MyChart".  Sign up information is provided on this After Visit Summary.  MyChart is used to connect with patients for Virtual Visits (Telemedicine).  Patients are able to view lab/test results, encounter notes, upcoming appointments, etc.  Non-urgent messages can be sent to your provider as well.   To learn more about what you can do with MyChart, go to [email or web address].    Your next appointment:   9 month(s)  The format for your next appointment:   In Person  Provider:   Rajan Revankar, MD   Other Instructions NA  

## 2022-02-19 DIAGNOSIS — Z23 Encounter for immunization: Secondary | ICD-10-CM | POA: Diagnosis not present

## 2022-02-20 DIAGNOSIS — Z4789 Encounter for other orthopedic aftercare: Secondary | ICD-10-CM | POA: Diagnosis not present

## 2022-02-20 DIAGNOSIS — M79644 Pain in right finger(s): Secondary | ICD-10-CM | POA: Diagnosis not present

## 2022-02-27 DIAGNOSIS — M79644 Pain in right finger(s): Secondary | ICD-10-CM | POA: Diagnosis not present

## 2022-03-05 DIAGNOSIS — M79644 Pain in right finger(s): Secondary | ICD-10-CM | POA: Diagnosis not present

## 2022-03-12 ENCOUNTER — Encounter (HOSPITAL_COMMUNITY): Payer: Self-pay | Admitting: Orthopedic Surgery

## 2022-03-12 DIAGNOSIS — E1122 Type 2 diabetes mellitus with diabetic chronic kidney disease: Secondary | ICD-10-CM | POA: Diagnosis not present

## 2022-03-12 DIAGNOSIS — I10 Essential (primary) hypertension: Secondary | ICD-10-CM | POA: Diagnosis not present

## 2022-03-13 DIAGNOSIS — E1122 Type 2 diabetes mellitus with diabetic chronic kidney disease: Secondary | ICD-10-CM | POA: Diagnosis not present

## 2022-03-13 DIAGNOSIS — E782 Mixed hyperlipidemia: Secondary | ICD-10-CM | POA: Diagnosis not present

## 2022-03-14 DIAGNOSIS — Z4789 Encounter for other orthopedic aftercare: Secondary | ICD-10-CM | POA: Diagnosis not present

## 2022-03-18 DIAGNOSIS — Z23 Encounter for immunization: Secondary | ICD-10-CM | POA: Diagnosis not present

## 2022-03-18 DIAGNOSIS — I10 Essential (primary) hypertension: Secondary | ICD-10-CM | POA: Diagnosis not present

## 2022-03-18 DIAGNOSIS — E785 Hyperlipidemia, unspecified: Secondary | ICD-10-CM | POA: Diagnosis not present

## 2022-03-18 DIAGNOSIS — I2510 Atherosclerotic heart disease of native coronary artery without angina pectoris: Secondary | ICD-10-CM | POA: Diagnosis not present

## 2022-03-18 DIAGNOSIS — E1169 Type 2 diabetes mellitus with other specified complication: Secondary | ICD-10-CM | POA: Diagnosis not present

## 2022-03-18 DIAGNOSIS — Z6824 Body mass index (BMI) 24.0-24.9, adult: Secondary | ICD-10-CM | POA: Diagnosis not present

## 2022-03-22 DIAGNOSIS — Z6824 Body mass index (BMI) 24.0-24.9, adult: Secondary | ICD-10-CM | POA: Diagnosis not present

## 2022-03-22 DIAGNOSIS — E1169 Type 2 diabetes mellitus with other specified complication: Secondary | ICD-10-CM | POA: Diagnosis not present

## 2022-03-22 DIAGNOSIS — E1122 Type 2 diabetes mellitus with diabetic chronic kidney disease: Secondary | ICD-10-CM | POA: Diagnosis not present

## 2022-03-22 DIAGNOSIS — N1831 Chronic kidney disease, stage 3a: Secondary | ICD-10-CM | POA: Diagnosis not present

## 2022-03-22 DIAGNOSIS — E785 Hyperlipidemia, unspecified: Secondary | ICD-10-CM | POA: Diagnosis not present

## 2022-03-22 DIAGNOSIS — I10 Essential (primary) hypertension: Secondary | ICD-10-CM | POA: Diagnosis not present

## 2022-03-27 DIAGNOSIS — M79644 Pain in right finger(s): Secondary | ICD-10-CM | POA: Diagnosis not present

## 2022-04-02 DIAGNOSIS — Z6823 Body mass index (BMI) 23.0-23.9, adult: Secondary | ICD-10-CM | POA: Diagnosis not present

## 2022-04-02 DIAGNOSIS — E860 Dehydration: Secondary | ICD-10-CM | POA: Diagnosis not present

## 2022-04-11 DIAGNOSIS — E1169 Type 2 diabetes mellitus with other specified complication: Secondary | ICD-10-CM | POA: Diagnosis not present

## 2022-04-11 DIAGNOSIS — R197 Diarrhea, unspecified: Secondary | ICD-10-CM | POA: Diagnosis not present

## 2022-04-11 DIAGNOSIS — Z6824 Body mass index (BMI) 24.0-24.9, adult: Secondary | ICD-10-CM | POA: Diagnosis not present

## 2022-04-11 DIAGNOSIS — R3915 Urgency of urination: Secondary | ICD-10-CM | POA: Diagnosis not present

## 2022-04-11 DIAGNOSIS — I10 Essential (primary) hypertension: Secondary | ICD-10-CM | POA: Diagnosis not present

## 2022-04-12 DIAGNOSIS — R197 Diarrhea, unspecified: Secondary | ICD-10-CM | POA: Diagnosis not present

## 2022-04-12 DIAGNOSIS — E1169 Type 2 diabetes mellitus with other specified complication: Secondary | ICD-10-CM | POA: Diagnosis not present

## 2022-04-12 DIAGNOSIS — I10 Essential (primary) hypertension: Secondary | ICD-10-CM | POA: Diagnosis not present

## 2022-04-25 DIAGNOSIS — Z4789 Encounter for other orthopedic aftercare: Secondary | ICD-10-CM | POA: Diagnosis not present

## 2022-04-25 DIAGNOSIS — M79644 Pain in right finger(s): Secondary | ICD-10-CM | POA: Diagnosis not present

## 2022-05-11 DIAGNOSIS — I10 Essential (primary) hypertension: Secondary | ICD-10-CM | POA: Diagnosis not present

## 2022-05-11 DIAGNOSIS — E1169 Type 2 diabetes mellitus with other specified complication: Secondary | ICD-10-CM | POA: Diagnosis not present

## 2022-05-13 DIAGNOSIS — E1169 Type 2 diabetes mellitus with other specified complication: Secondary | ICD-10-CM | POA: Diagnosis not present

## 2022-05-13 DIAGNOSIS — I10 Essential (primary) hypertension: Secondary | ICD-10-CM | POA: Diagnosis not present

## 2022-05-22 DIAGNOSIS — I10 Essential (primary) hypertension: Secondary | ICD-10-CM | POA: Diagnosis not present

## 2022-05-22 DIAGNOSIS — Z139 Encounter for screening, unspecified: Secondary | ICD-10-CM | POA: Diagnosis not present

## 2022-05-22 DIAGNOSIS — Z6824 Body mass index (BMI) 24.0-24.9, adult: Secondary | ICD-10-CM | POA: Diagnosis not present

## 2022-06-03 DIAGNOSIS — J069 Acute upper respiratory infection, unspecified: Secondary | ICD-10-CM | POA: Diagnosis not present

## 2022-06-06 DIAGNOSIS — Z139 Encounter for screening, unspecified: Secondary | ICD-10-CM | POA: Diagnosis not present

## 2022-06-06 DIAGNOSIS — Z6824 Body mass index (BMI) 24.0-24.9, adult: Secondary | ICD-10-CM | POA: Diagnosis not present

## 2022-06-06 DIAGNOSIS — I152 Hypertension secondary to endocrine disorders: Secondary | ICD-10-CM | POA: Diagnosis not present

## 2022-06-06 DIAGNOSIS — E1159 Type 2 diabetes mellitus with other circulatory complications: Secondary | ICD-10-CM | POA: Diagnosis not present

## 2022-06-11 DIAGNOSIS — I10 Essential (primary) hypertension: Secondary | ICD-10-CM | POA: Diagnosis not present

## 2022-06-11 DIAGNOSIS — E1169 Type 2 diabetes mellitus with other specified complication: Secondary | ICD-10-CM | POA: Diagnosis not present

## 2022-06-12 DIAGNOSIS — I10 Essential (primary) hypertension: Secondary | ICD-10-CM | POA: Diagnosis not present

## 2022-06-12 DIAGNOSIS — R9389 Abnormal findings on diagnostic imaging of other specified body structures: Secondary | ICD-10-CM | POA: Diagnosis not present

## 2022-06-13 DIAGNOSIS — E1169 Type 2 diabetes mellitus with other specified complication: Secondary | ICD-10-CM | POA: Diagnosis not present

## 2022-06-13 DIAGNOSIS — I10 Essential (primary) hypertension: Secondary | ICD-10-CM | POA: Diagnosis not present

## 2022-06-15 DIAGNOSIS — J069 Acute upper respiratory infection, unspecified: Secondary | ICD-10-CM | POA: Diagnosis not present

## 2022-06-15 DIAGNOSIS — R42 Dizziness and giddiness: Secondary | ICD-10-CM | POA: Diagnosis not present

## 2022-06-15 DIAGNOSIS — E1165 Type 2 diabetes mellitus with hyperglycemia: Secondary | ICD-10-CM | POA: Diagnosis not present

## 2022-06-25 DIAGNOSIS — E1169 Type 2 diabetes mellitus with other specified complication: Secondary | ICD-10-CM | POA: Diagnosis not present

## 2022-06-28 DIAGNOSIS — Z6824 Body mass index (BMI) 24.0-24.9, adult: Secondary | ICD-10-CM | POA: Diagnosis not present

## 2022-06-28 DIAGNOSIS — E785 Hyperlipidemia, unspecified: Secondary | ICD-10-CM | POA: Diagnosis not present

## 2022-06-28 DIAGNOSIS — E1169 Type 2 diabetes mellitus with other specified complication: Secondary | ICD-10-CM | POA: Diagnosis not present

## 2022-06-28 DIAGNOSIS — I10 Essential (primary) hypertension: Secondary | ICD-10-CM | POA: Diagnosis not present

## 2022-06-28 DIAGNOSIS — I2510 Atherosclerotic heart disease of native coronary artery without angina pectoris: Secondary | ICD-10-CM | POA: Diagnosis not present

## 2022-07-11 DIAGNOSIS — I10 Essential (primary) hypertension: Secondary | ICD-10-CM | POA: Diagnosis not present

## 2022-07-11 DIAGNOSIS — E1169 Type 2 diabetes mellitus with other specified complication: Secondary | ICD-10-CM | POA: Diagnosis not present

## 2022-07-12 DIAGNOSIS — E1169 Type 2 diabetes mellitus with other specified complication: Secondary | ICD-10-CM | POA: Diagnosis not present

## 2022-07-12 DIAGNOSIS — I10 Essential (primary) hypertension: Secondary | ICD-10-CM | POA: Diagnosis not present

## 2022-07-26 ENCOUNTER — Other Ambulatory Visit: Payer: Self-pay

## 2022-07-26 DIAGNOSIS — E1169 Type 2 diabetes mellitus with other specified complication: Secondary | ICD-10-CM | POA: Insufficient documentation

## 2022-07-26 DIAGNOSIS — Z6824 Body mass index (BMI) 24.0-24.9, adult: Secondary | ICD-10-CM | POA: Insufficient documentation

## 2022-07-26 DIAGNOSIS — E785 Hyperlipidemia, unspecified: Secondary | ICD-10-CM | POA: Insufficient documentation

## 2022-08-09 ENCOUNTER — Encounter: Payer: Self-pay | Admitting: Cardiology

## 2022-08-09 ENCOUNTER — Ambulatory Visit: Payer: Medicare HMO | Attending: Cardiology | Admitting: Cardiology

## 2022-08-09 VITALS — BP 132/70 | HR 64 | Ht 69.0 in | Wt 164.2 lb

## 2022-08-09 DIAGNOSIS — I2510 Atherosclerotic heart disease of native coronary artery without angina pectoris: Secondary | ICD-10-CM | POA: Diagnosis not present

## 2022-08-09 DIAGNOSIS — I10 Essential (primary) hypertension: Secondary | ICD-10-CM | POA: Diagnosis not present

## 2022-08-09 DIAGNOSIS — E782 Mixed hyperlipidemia: Secondary | ICD-10-CM

## 2022-08-09 NOTE — Patient Instructions (Signed)
Medication Instructions:  Your physician recommends that you continue on your current medications as directed. Please refer to the Current Medication list given to you today.  *If you need a refill on your cardiac medications before your next appointment, please call your pharmacy*   Lab Work: None If you have labs (blood work) drawn today and your tests are completely normal, you will receive your results only by: MyChart Message (if you have MyChart) OR A paper copy in the mail If you have any lab test that is abnormal or we need to change your treatment, we will call you to review the results.   Testing/Procedures: None   Follow-Up: At [facility], you and your health needs are our priority.  As part of our continuing mission to provide you with exceptional heart care, we have created designated Provider Care Teams.  These Care Teams include your primary Cardiologist (physician) and Advanced Practice Providers (APPs -  Physician Assistants and Nurse Practitioners) who all work together to provide you with the care you need, when you need it.  We recommend signing up for the patient portal called "MyChart".  Sign up information is provided on this After Visit Summary.  MyChart is used to connect with patients for Virtual Visits (Telemedicine).  Patients are able to view lab/test results, encounter notes, upcoming appointments, etc.  Non-urgent messages can be sent to your provider as well.   To learn more about what you can do with MyChart, go to [email or web address].    Your next appointment:   9 month(s)  Provider:   Rajan Revankar, MD    Other Instructions None  

## 2022-08-09 NOTE — Progress Notes (Signed)
Cardiology Office Note:    Date:  08/09/2022   ID:  Wayne Marshall, DOB 1951-04-19, MRN [id number]  PCP:  Maryella Shivers, MD  Cardiologist:  Jenean Lindau, MD   Referring MD: Maryella Shivers, MD    ASSESSMENT:    1. Coronary artery disease involving native coronary artery of native heart without angina pectoris   2. Primary hypertension   3. Mixed dyslipidemia    PLAN:    In order of problems listed above:  Coronary artery disease: Secondary prevention stressed with the patient.  Importance of compliance with diet medication stressed and he vocalized understanding.  He was advised to walk at least half an hour a day 5 days a week and he promises to do so. Essential hypertension: Blood pressure stable and diet was emphasized. Mixed dyslipidemia: On statin therapy.  Followed by primary care.  Labs reviewed from [facility] sheet and found to be fine. Diabetes mellitus: Managed by primary care.  Diet exercise emphasized he seems to be compliant with this.  He is wife accompanied him for this visit. Patient will be seen in follow-up appointment in 9 months or earlier if the patient has any concerns.    Medication Adjustments/Labs and Tests Ordered: Current medicines are reviewed at length with the patient today.  Concerns regarding medicines are outlined above.  No orders of the defined types were placed in this encounter.  No orders of the defined types were placed in this encounter.    No chief complaint on file.    History of Present Illness:    Wayne Marshall is a 72 y.o. male.  Patient has past medical history of coronary artery disease, essential hypertension, mixed dyslipidemia and diabetes mellitus.  He denies any problems at this time and takes care of activities of daily living.  No chest pain orthopnea or PND.  He works part-time and walks some on a regular basis.  At the time of my evaluation, the patient is alert awake oriented and in no distress.  Past  Medical History:  Diagnosis Date   Acute respiratory failure with hypoxia ([place]) 06/30/2019   AKI (acute kidney injury) ([place]) 06/30/2019   BMI 24.0-24.9, adult    CAD (coronary artery disease) 06/29/2020   Chronic kidney disease    CKD stage 3 secondary to diabetes ([place]) 06/30/2019   Contusion of right thumb 08/27/2021   COVID-19 2021   Diabetes mellitus without complication ([place])    DM2 (diabetes mellitus, type 2) ([place]) 06/30/2019   Heart murmur    trace-mild AI 03/14/21 echo   HTN (hypertension) 06/30/2019   Hyperlipidemia    Hyperlipidemia associated with type 2 diabetes mellitus ([place])    Hypertension    Hypoxemia    Impingement syndrome of left shoulder region 11/12/2017   Injury of ligament of hand 08/27/2021   Mixed dyslipidemia 12/01/2020   Pneumonia due to COVID-19 virus 06/30/2019   STEMI (ST elevation myocardial infarction) ([place]) 06/19/2020   STEMI involving right coronary artery ([place]) 06/19/2020   Stroke ([place]) 04/19/2011    Past Surgical History:  Procedure Laterality Date   CORONARY STENT INTERVENTION N/A 06/19/2020   Procedure: CORONARY STENT INTERVENTION;  Surgeon: Nelva Bush, MD;  Location: [place] [facility];  Service: Cardiovascular;  Laterality: N/A;   CORONARY STENT INTERVENTION N/A 06/21/2020   Procedure: CORONARY STENT INTERVENTION;  Surgeon: Wellington Hampshire, MD;  Location: [place] [facility];  Service: Cardiovascular;  Laterality: N/A;   INTRAVASCULAR ULTRASOUND/IVUS N/A 06/21/2020   Procedure:  Intravascular Ultrasound/IVUS;  Surgeon: Wellington Hampshire, MD;  Location: [facility] [facility];  Service: Cardiovascular;  Laterality: N/A;   LEFT HEART CATH AND CORONARY ANGIOGRAPHY N/A 06/19/2020   Procedure: LEFT HEART CATH AND CORONARY ANGIOGRAPHY;  Surgeon: Nelva Bush, MD;  Location: [place] [facility];  Service: Cardiovascular;  Laterality: N/A;   SHOULDER ARTHROSCOPY Left    Dr Onnie Graham    Current Medications: Current Meds  Medication Sig    atorvastatin (LIPITOR) 80 MG tablet Take 1 tablet (80 mg total) by mouth daily.   carvedilol (COREG) 6.25 MG tablet Take 6.25 mg by mouth 3 (three) times daily.   clopidogrel (PLAVIX) 75 MG tablet Take 1 tablet (75 mg total) by mouth daily.   FARXIGA 10 MG TABS tablet Take 10 mg by mouth daily.   fluticasone (FLONASE) 50 MCG/ACT nasal spray Place 1 spray into both nostrils daily.   hydrALAZINE (APRESOLINE) 25 MG tablet Take 25 mg by mouth 3 (three) times daily.   metFORMIN (GLUCOPHAGE) 1000 MG tablet Take 1,000 mg by mouth 2 (two) times daily with a meal.   Misc Natural Products (URINOZINC PROSTATE) CAPS Take 1 capsule by mouth daily.   Multiple Vitamin (MULTIVITAMIN) capsule Take 1 capsule by mouth daily.   nitroGLYCERIN (NITROSTAT) 0.4 MG SL tablet Place 0.4 mg under the tongue every 5 (five) minutes as needed for chest pain.   oxyCODONE (ROXICODONE) 5 MG immediate release tablet Take 1 tablet (5 mg total) by mouth every 4 (four) hours as needed.   Psyllium (METAMUCIL) 0.36 g CAPS Take 0.36 g by mouth at bedtime.   spironolactone (ALDACTONE) 25 MG tablet Take 25 mg by mouth daily.   valsartan (DIOVAN) 320 MG tablet Take 320 mg by mouth daily.     Allergies:   Patient has no known allergies.   Social History   Socioeconomic History   Marital status: Married    Spouse name: margie Mattson   Number of children: Not on file   Years of education: Not on file   Highest education level: Not on file  Occupational History   Not on file  Tobacco Use   Smoking status: Never   Smokeless tobacco: Never  Vaping Use   Vaping Use: Never used  Substance and Sexual Activity   Alcohol use: Never   Drug use: Not Currently    Types: Marijuana    Comment: in college   Sexual activity: Never  Other Topics Concern   Not on file  Social History Narrative   Not on file   Social Determinants of Health   Financial Resource Strain: Not on file  Food Insecurity: Not on file  Transportation Needs:  Not on file  Physical Activity: Not on file  Stress: Not on file  Social Connections: Not on file     Family History: The patient's family history includes Cancer in his father. There is no history of Heart disease.  ROS:   Please see the history of present illness.    All other systems reviewed and are negative.  EKGs/Labs/Other Studies Reviewed:    The following studies were reviewed today: I discussed my findings with the patient at length   Recent Labs: 02/05/2022: BUN 23; Creatinine, Ser 1.26; Hemoglobin 15.2; Platelets 261; Potassium 3.6; Sodium 140  Recent Lipid Panel    Component Value Date/Time   CHOL 145 11/30/2020 0910   TRIG 60 11/30/2020 0910   HDL 45 11/30/2020 0910   CHOLHDL 3.2 11/30/2020 0910   CHOLHDL  3.4 06/19/2020 1630   VLDL 22 06/19/2020 1630   LDLCALC 88 11/30/2020 0910    Physical Exam:    VS:  BP 132/70   Pulse 64   Ht 5\' 9"  (1.753 m)   Wt 164 lb 3.2 oz (74.5 kg)   SpO2 99%   BMI 24.25 kg/m     Wt Readings from Last 3 Encounters:  08/09/22 164 lb 3.2 oz (74.5 kg)  02/13/22 166 lb 6.4 oz (75.5 kg)  02/05/22 175 lb (79.4 kg)     GEN: Patient is in no acute distress HEENT: Normal NECK: No JVD; No carotid bruits LYMPHATICS: No lymphadenopathy CARDIAC: Hear sounds regular, 2/6 systolic murmur at the apex. RESPIRATORY:  Clear to auscultation without rales, wheezing or rhonchi  ABDOMEN: Soft, non-tender, non-distended MUSCULOSKELETAL:  No edema; No deformity  SKIN: Warm and dry NEUROLOGIC:  Alert and oriented x 3 PSYCHIATRIC:  Normal affect   Signed, Jenean Lindau, MD  08/09/2022 1:42 PM    [facility]

## 2022-08-11 DIAGNOSIS — E1169 Type 2 diabetes mellitus with other specified complication: Secondary | ICD-10-CM | POA: Diagnosis not present

## 2022-08-11 DIAGNOSIS — I10 Essential (primary) hypertension: Secondary | ICD-10-CM | POA: Diagnosis not present

## 2022-09-04 ENCOUNTER — Ambulatory Visit: Payer: Medicare Other | Admitting: Cardiology

## 2022-09-05 DIAGNOSIS — S61215A Laceration without foreign body of left ring finger without damage to nail, initial encounter: Secondary | ICD-10-CM | POA: Diagnosis not present

## 2022-09-10 DIAGNOSIS — I10 Essential (primary) hypertension: Secondary | ICD-10-CM | POA: Diagnosis not present

## 2022-09-10 DIAGNOSIS — E1169 Type 2 diabetes mellitus with other specified complication: Secondary | ICD-10-CM | POA: Diagnosis not present

## 2022-09-11 DIAGNOSIS — I10 Essential (primary) hypertension: Secondary | ICD-10-CM | POA: Diagnosis not present

## 2022-09-11 DIAGNOSIS — E1169 Type 2 diabetes mellitus with other specified complication: Secondary | ICD-10-CM | POA: Diagnosis not present

## 2022-09-12 DIAGNOSIS — R972 Elevated prostate specific antigen [PSA]: Secondary | ICD-10-CM | POA: Diagnosis not present

## 2022-09-13 ENCOUNTER — Other Ambulatory Visit: Payer: Self-pay | Admitting: Urology

## 2022-09-13 ENCOUNTER — Other Ambulatory Visit: Payer: Self-pay

## 2022-09-13 ENCOUNTER — Emergency Department (HOSPITAL_COMMUNITY)
Admission: EM | Admit: 2022-09-13 | Discharge: 2022-09-13 | Disposition: A | Discharge status: Home / Self Care | Payer: Medicare HMO | Attending: Emergency Medicine | Admitting: Emergency Medicine

## 2022-09-13 ENCOUNTER — Encounter (HOSPITAL_COMMUNITY): Payer: Self-pay

## 2022-09-13 DIAGNOSIS — I10 Essential (primary) hypertension: Secondary | ICD-10-CM | POA: Diagnosis not present

## 2022-09-13 DIAGNOSIS — Z7902 Long term (current) use of antithrombotics/antiplatelets: Secondary | ICD-10-CM | POA: Diagnosis not present

## 2022-09-13 DIAGNOSIS — R42 Dizziness and giddiness: Secondary | ICD-10-CM | POA: Insufficient documentation

## 2022-09-13 DIAGNOSIS — R55 Syncope and collapse: Secondary | ICD-10-CM | POA: Insufficient documentation

## 2022-09-13 DIAGNOSIS — R61 Generalized hyperhidrosis: Secondary | ICD-10-CM | POA: Diagnosis not present

## 2022-09-13 DIAGNOSIS — R972 Elevated prostate specific antigen [PSA]: Secondary | ICD-10-CM

## 2022-09-13 DIAGNOSIS — Z743 Need for continuous supervision: Secondary | ICD-10-CM | POA: Diagnosis not present

## 2022-09-13 LAB — BASIC METABOLIC PANEL
Anion gap: 7 (ref 5–15)
BUN: 24 mg/dL — ABNORMAL HIGH (ref 8–23)
CO2: 23 mmol/L (ref 22–32)
Calcium: 8.8 mg/dL — ABNORMAL LOW (ref 8.9–10.3)
Chloride: 109 mmol/L (ref 98–111)
Creatinine, Ser: 1.25 mg/dL — ABNORMAL HIGH (ref 0.61–1.24)
GFR, Estimated: >60 mL/min (ref >60)
Glucose, Bld: 122 mg/dL — ABNORMAL HIGH (ref 70–99)
Potassium: 3.9 mmol/L (ref 3.5–5.1)
Sodium: 139 mmol/L (ref 135–145)

## 2022-09-13 LAB — CBC WITH DIFFERENTIAL/PLATELET
Abs Immature Granulocytes: 0.12 10*3/uL — ABNORMAL HIGH (ref 0.00–0.07)
Basophils Absolute: 0.1 10*3/uL (ref 0.0–0.1)
Basophils Relative: 1 %
Eosinophils Absolute: 0 10*3/uL (ref 0.0–0.5)
Eosinophils Relative: 0 %
HCT: 38.8 % — ABNORMAL LOW (ref 39.0–52.0)
Hemoglobin: 13.1 g/dL (ref 13.0–17.0)
Immature Granulocytes: 1 %
Lymphocytes Relative: 13 %
Lymphs Abs: 1.1 10*3/uL (ref 0.7–4.0)
MCH: 30.4 pg (ref 26.0–34.0)
MCHC: 33.8 g/dL (ref 30.0–36.0)
MCV: 90 fL (ref 80.0–100.0)
Monocytes Absolute: 0.5 10*3/uL (ref 0.1–1.0)
Monocytes Relative: 6 %
Neutro Abs: 7.3 10*3/uL (ref 1.7–7.7)
Neutrophils Relative %: 79 %
Platelets: 254 10*3/uL (ref 150–400)
RBC: 4.31 MIL/uL (ref 4.22–5.81)
RDW: 14 % (ref 11.5–15.5)
WBC: 9.1 10*3/uL (ref 4.0–10.5)
nRBC: 0 % (ref 0.0–0.2)

## 2022-09-13 NOTE — ED Triage Notes (Signed)
[place] [employer] [place] Became diaphoretic and dizzy, did not fall. Initial SBP in 60s. Given 1L NS by EMS.

## 2022-09-13 NOTE — ED Notes (Signed)
Patient ambulatory to bathroom and back to room with steady gait.

## 2022-09-13 NOTE — Discharge Instructions (Addendum)
You are seen in the ER for dizziness.  It appears to me that you likely had a vertiginous symptom.  We recommend that you follow-up with your primary care doctor to ensure that you do not need additional neurologic workup.  If you have repeat episode of vertigo that is not resolving or you have associated symptoms such as double vision, difficulty in swallowing, slurred speech or one-sided weakness or numbness -call 911 immediately.

## 2022-09-17 NOTE — ED Provider Notes (Signed)
[place] EMERGENCY DEPARTMENT AT [facility] Provider Note   CSN: [id number] Arrival date & time: 09/13/22  1248     History  Chief Complaint  Patient presents with   Near Syncope    Wayne Marshall is a 72 y.o. male.  HPI    72 year old male comes in with chief complaint of near fainting.  Patient is on aspirin.  He was visiting [facility] for graduation and [employer].  While there, he started feeling slightly off.  He decided to not go to the lower section.  Somebody asked him to move to a different part, but then that individual noted that patient was having difficulty with balance and call for help.  Patient states that he suddenly started feeling dizzy, became sweaty and felt like his balance was off.  He states that it was a hot day, and he felt generally off until the symptoms began.  He had no chest pain, shortness of breath, palpitation.  When EMS arrived, patient's systolic blood pressure was in the 60s.  Patient was given some IV fluids and he feels better.  Patient has history of stroke, but he did not have similar symptoms with that.  During my assessment, patient feels great.  Review of system is negative for any diplopia, dysarthria, dysphagia, focal weakness, numbness.   Wife is at the bedside.  She states that patient has had couple of episodes of dizziness that he has mentioned to her.  1 episode occurred while he was golfing with his friends, and he felt dizzy just for few seconds. Home Medications Prior to Admission medications   Medication Sig Start Date End Date Taking? Authorizing Provider  atorvastatin (LIPITOR) 80 MG tablet Take 1 tablet (80 mg total) by mouth daily. 06/22/20   Arty Baumgartner, NP  carvedilol (COREG) 6.25 MG tablet Take 6.25 mg by mouth 3 (three) times daily. 08/09/22   [provider]  clopidogrel (PLAVIX) 75 MG tablet Take 1 tablet (75 mg total) by mouth daily. 01/08/22   Alben Spittle, Scott T, PA-C  FARXIGA 10 MG TABS tablet  Take 10 mg by mouth daily.    [provider]  fluticasone (FLONASE) 50 MCG/ACT nasal spray Place 1 spray into both nostrils daily.    [provider]  hydrALAZINE (APRESOLINE) 25 MG tablet Take 25 mg by mouth 3 (three) times daily. 07/12/22   [provider]  metFORMIN (GLUCOPHAGE) 1000 MG tablet Take 1,000 mg by mouth 2 (two) times daily with a meal.    [provider]  Misc Natural Products (URINOZINC PROSTATE) CAPS Take 1 capsule by mouth daily.    [provider]  Multiple Vitamin (MULTIVITAMIN) capsule Take 1 capsule by mouth daily.    [provider]  nitroGLYCERIN (NITROSTAT) 0.4 MG SL tablet Place 0.4 mg under the tongue every 5 (five) minutes as needed for chest pain.    [provider]  oxyCODONE (ROXICODONE) 5 MG immediate release tablet Take 1 tablet (5 mg total) by mouth every 4 (four) hours as needed. 02/05/22 02/05/23  Dominica Severin, MD  Psyllium (METAMUCIL) 0.36 g CAPS Take 0.36 g by mouth at bedtime.    [provider]  spironolactone (ALDACTONE) 25 MG tablet Take 25 mg by mouth daily. 07/25/22   [provider]  valsartan (DIOVAN) 320 MG tablet Take 320 mg by mouth daily. 12/10/21   [provider]      Allergies    Patient has no known allergies.  Review of Systems   Review of Systems  All other systems reviewed and are negative.   Physical Exam Updated Vital Signs BP 128/80   Pulse 64   Temp 97.7 F (36.5 C) (Oral)   Resp 13   Ht 5\' 9"  (1.753 m)   Wt 73.5 kg   SpO2 100%   BMI 23.92 kg/m  Physical Exam Constitutional:      Appearance: He is well-developed.  HENT:     Head: Normocephalic and atraumatic.  Eyes:     Conjunctiva/sclera: Conjunctivae normal.     Pupils: Pupils are equal, round, and reactive to light.  Cardiovascular:     Rate and Rhythm: Normal rate and regular rhythm.  Pulmonary:     Effort: Pulmonary effort is normal.     Breath sounds: Normal breath  sounds.  Abdominal:     General: Bowel sounds are normal. There is no distension.     Palpations: Abdomen is soft.     Tenderness: There is no abdominal tenderness. There is no guarding or rebound.  Musculoskeletal:     Cervical back: Normal range of motion and neck supple.  Skin:    General: Skin is warm.  Neurological:     Mental Status: He is alert and oriented to person, place, and time.     Cranial Nerves: No cranial nerve deficit.     Sensory: No sensory deficit.     Motor: No weakness.     Coordination: Coordination normal.     Comments: No nystagmus, no dysmetria     ED Results / Procedures / Treatments   Labs (all labs ordered are listed, but only abnormal results are displayed) Labs Reviewed  BASIC METABOLIC PANEL - Abnormal; Notable for the following components:      Result Value   Glucose, Bld 122 (*)    BUN 24 (*)    Creatinine, Ser 1.25 (*)    Calcium 8.8 (*)    All other components within normal limits  CBC WITH DIFFERENTIAL/PLATELET - Abnormal; Notable for the following components:   HCT 38.8 (*)    Abs Immature Granulocytes 0.12 (*)    All other components within normal limits    EKG EKG Interpretation  Date/Time:  Friday Sep 13 2022 12:56:20 EDT Ventricular Rate:  60 PR Interval:  189 QRS Duration: 91 QT Interval:  425 QTC Calculation: 425 R Axis:   54 Text Interpretation: Sinus rhythm No acute changes No significant change since last tracing Confirmed by Derwood Kaplan [id number]) on 09/13/2022 1:52:30 PM  Radiology No results found.  Procedures Procedures    Medications Ordered in ED Medications - No data to display  ED Course/ Medical Decision Making/ A&P                             Medical Decision Making Amount and/or Complexity of Data Reviewed Labs: ordered.   71 year old male comes in with chief complaint of near fainting.  Symptoms were preceded by an episode of vertigo.  Patient already felt slightly off when the episode  occurred.  The episode lasted for few seconds, but patient was hypotensive at that time as well.  Differential diagnosis considered for this patient includes  DDx includes: Central vertigo:  Tumor  Stroke  ICH  Vertebrobasilar TIA  Peripheral Vertigo:  BPPV  Vestibular neuritis  Meniere disease  Migrainous vertigo  Ear Infection   Also considered in the differential is arrhythmia, severe electrolyte  abnormality, severe dehydration and orthostasis.  In the ER patient is feeling great.  His vital signs are stable and within normal limits.  He has normal neurologic exam.  Patient wants to go home.  Wife comfortable with him going home.  Plan is for him to follow-up with his PCP.  Overall, it appears to be an episode that was provoked by patient being too hot or a vasovagal spell.  Final Clinical Impression(s) / ED Diagnoses Final diagnoses:  Vertigo    Rx / DC Orders ED Discharge Orders     None         Derwood Kaplan, MD 09/17/22 1925

## 2022-09-18 DIAGNOSIS — Z6823 Body mass index (BMI) 23.0-23.9, adult: Secondary | ICD-10-CM | POA: Diagnosis not present

## 2022-09-18 DIAGNOSIS — Z7689 Persons encountering health services in other specified circumstances: Secondary | ICD-10-CM | POA: Diagnosis not present

## 2022-09-18 DIAGNOSIS — E860 Dehydration: Secondary | ICD-10-CM | POA: Diagnosis not present

## 2022-09-18 DIAGNOSIS — R55 Syncope and collapse: Secondary | ICD-10-CM | POA: Diagnosis not present

## 2022-09-26 ENCOUNTER — Encounter: Payer: Self-pay | Admitting: Urology

## 2022-10-11 DIAGNOSIS — I10 Essential (primary) hypertension: Secondary | ICD-10-CM | POA: Diagnosis not present

## 2022-10-11 DIAGNOSIS — E1169 Type 2 diabetes mellitus with other specified complication: Secondary | ICD-10-CM | POA: Diagnosis not present

## 2022-10-12 DIAGNOSIS — I10 Essential (primary) hypertension: Secondary | ICD-10-CM | POA: Diagnosis not present

## 2022-10-12 DIAGNOSIS — E1169 Type 2 diabetes mellitus with other specified complication: Secondary | ICD-10-CM | POA: Diagnosis not present

## 2022-10-24 ENCOUNTER — Encounter: Payer: Self-pay | Admitting: Urology

## 2022-10-25 DIAGNOSIS — E1169 Type 2 diabetes mellitus with other specified complication: Secondary | ICD-10-CM | POA: Diagnosis not present

## 2022-10-29 ENCOUNTER — Ambulatory Visit
Admission: RE | Admit: 2022-10-29 | Discharge: 2022-10-29 | Disposition: A | Discharge status: Home / Self Care | Payer: Medicare HMO | Source: Ambulatory Visit | Attending: Urology | Admitting: Urology

## 2022-10-29 DIAGNOSIS — R972 Elevated prostate specific antigen [PSA]: Secondary | ICD-10-CM

## 2022-10-29 DIAGNOSIS — N400 Enlarged prostate without lower urinary tract symptoms: Secondary | ICD-10-CM | POA: Diagnosis not present

## 2022-10-29 MED ORDER — GADOPICLENOL 0.5 MMOL/ML IV SOLN
8.0000 mL | Freq: Once | INTRAVENOUS | Status: AC | PRN
  Administered 2022-10-29: 8 mL via INTRAVENOUS

## 2022-11-01 DIAGNOSIS — E1122 Type 2 diabetes mellitus with diabetic chronic kidney disease: Secondary | ICD-10-CM | POA: Diagnosis not present

## 2022-11-01 DIAGNOSIS — E1159 Type 2 diabetes mellitus with other circulatory complications: Secondary | ICD-10-CM | POA: Diagnosis not present

## 2022-11-01 DIAGNOSIS — Z6823 Body mass index (BMI) 23.0-23.9, adult: Secondary | ICD-10-CM | POA: Diagnosis not present

## 2022-11-01 DIAGNOSIS — I152 Hypertension secondary to endocrine disorders: Secondary | ICD-10-CM | POA: Diagnosis not present

## 2022-11-01 DIAGNOSIS — Z1331 Encounter for screening for depression: Secondary | ICD-10-CM | POA: Diagnosis not present

## 2022-11-01 DIAGNOSIS — Z1339 Encounter for screening examination for other mental health and behavioral disorders: Secondary | ICD-10-CM | POA: Diagnosis not present

## 2022-11-01 DIAGNOSIS — Z0000 Encounter for general adult medical examination without abnormal findings: Secondary | ICD-10-CM | POA: Diagnosis not present

## 2022-11-01 DIAGNOSIS — E1169 Type 2 diabetes mellitus with other specified complication: Secondary | ICD-10-CM | POA: Diagnosis not present

## 2022-11-01 DIAGNOSIS — E785 Hyperlipidemia, unspecified: Secondary | ICD-10-CM | POA: Diagnosis not present

## 2022-11-01 DIAGNOSIS — Z136 Encounter for screening for cardiovascular disorders: Secondary | ICD-10-CM | POA: Diagnosis not present

## 2022-11-01 DIAGNOSIS — N1831 Chronic kidney disease, stage 3a: Secondary | ICD-10-CM | POA: Diagnosis not present

## 2022-11-01 DIAGNOSIS — Z139 Encounter for screening, unspecified: Secondary | ICD-10-CM | POA: Diagnosis not present

## 2022-11-10 DIAGNOSIS — E1169 Type 2 diabetes mellitus with other specified complication: Secondary | ICD-10-CM | POA: Diagnosis not present

## 2022-11-10 DIAGNOSIS — I10 Essential (primary) hypertension: Secondary | ICD-10-CM | POA: Diagnosis not present

## 2022-11-11 DIAGNOSIS — I10 Essential (primary) hypertension: Secondary | ICD-10-CM | POA: Diagnosis not present

## 2022-11-11 DIAGNOSIS — E1169 Type 2 diabetes mellitus with other specified complication: Secondary | ICD-10-CM | POA: Diagnosis not present

## 2022-12-11 DIAGNOSIS — E1169 Type 2 diabetes mellitus with other specified complication: Secondary | ICD-10-CM | POA: Diagnosis not present

## 2022-12-11 DIAGNOSIS — I10 Essential (primary) hypertension: Secondary | ICD-10-CM | POA: Diagnosis not present

## 2022-12-12 DIAGNOSIS — I10 Essential (primary) hypertension: Secondary | ICD-10-CM | POA: Diagnosis not present

## 2022-12-12 DIAGNOSIS — E1169 Type 2 diabetes mellitus with other specified complication: Secondary | ICD-10-CM | POA: Diagnosis not present

## 2023-01-11 DIAGNOSIS — E1169 Type 2 diabetes mellitus with other specified complication: Secondary | ICD-10-CM | POA: Diagnosis not present

## 2023-01-11 DIAGNOSIS — I10 Essential (primary) hypertension: Secondary | ICD-10-CM | POA: Diagnosis not present

## 2023-01-12 DIAGNOSIS — E1169 Type 2 diabetes mellitus with other specified complication: Secondary | ICD-10-CM | POA: Diagnosis not present

## 2023-01-12 DIAGNOSIS — I10 Essential (primary) hypertension: Secondary | ICD-10-CM | POA: Diagnosis not present

## 2023-02-05 DIAGNOSIS — Z008 Encounter for other general examination: Secondary | ICD-10-CM | POA: Diagnosis not present

## 2023-02-10 DIAGNOSIS — I10 Essential (primary) hypertension: Secondary | ICD-10-CM | POA: Diagnosis not present

## 2023-02-10 DIAGNOSIS — E1169 Type 2 diabetes mellitus with other specified complication: Secondary | ICD-10-CM | POA: Diagnosis not present

## 2023-02-11 DIAGNOSIS — E1169 Type 2 diabetes mellitus with other specified complication: Secondary | ICD-10-CM | POA: Diagnosis not present

## 2023-02-11 DIAGNOSIS — I10 Essential (primary) hypertension: Secondary | ICD-10-CM | POA: Diagnosis not present

## 2023-02-27 DIAGNOSIS — Z6823 Body mass index (BMI) 23.0-23.9, adult: Secondary | ICD-10-CM | POA: Diagnosis not present

## 2023-02-27 DIAGNOSIS — H8110 Benign paroxysmal vertigo, unspecified ear: Secondary | ICD-10-CM | POA: Diagnosis not present

## 2023-02-28 DIAGNOSIS — E1169 Type 2 diabetes mellitus with other specified complication: Secondary | ICD-10-CM | POA: Diagnosis not present

## 2023-03-10 DIAGNOSIS — Z23 Encounter for immunization: Secondary | ICD-10-CM | POA: Diagnosis not present

## 2023-03-13 DIAGNOSIS — E1169 Type 2 diabetes mellitus with other specified complication: Secondary | ICD-10-CM | POA: Diagnosis not present

## 2023-03-13 DIAGNOSIS — I10 Essential (primary) hypertension: Secondary | ICD-10-CM | POA: Diagnosis not present

## 2023-03-14 DIAGNOSIS — E1169 Type 2 diabetes mellitus with other specified complication: Secondary | ICD-10-CM | POA: Diagnosis not present

## 2023-03-14 DIAGNOSIS — I10 Essential (primary) hypertension: Secondary | ICD-10-CM | POA: Diagnosis not present

## 2023-04-11 DIAGNOSIS — R062 Wheezing: Secondary | ICD-10-CM | POA: Diagnosis not present

## 2023-04-11 DIAGNOSIS — R0981 Nasal congestion: Secondary | ICD-10-CM | POA: Diagnosis not present

## 2023-04-11 DIAGNOSIS — R509 Fever, unspecified: Secondary | ICD-10-CM | POA: Diagnosis not present

## 2023-04-11 DIAGNOSIS — J309 Allergic rhinitis, unspecified: Secondary | ICD-10-CM | POA: Diagnosis not present

## 2023-04-12 DIAGNOSIS — I10 Essential (primary) hypertension: Secondary | ICD-10-CM | POA: Diagnosis not present

## 2023-04-12 DIAGNOSIS — E1169 Type 2 diabetes mellitus with other specified complication: Secondary | ICD-10-CM | POA: Diagnosis not present

## 2023-04-13 DIAGNOSIS — E1169 Type 2 diabetes mellitus with other specified complication: Secondary | ICD-10-CM | POA: Diagnosis not present

## 2023-04-13 DIAGNOSIS — I10 Essential (primary) hypertension: Secondary | ICD-10-CM | POA: Diagnosis not present

## 2023-04-16 DIAGNOSIS — Z6823 Body mass index (BMI) 23.0-23.9, adult: Secondary | ICD-10-CM | POA: Diagnosis not present

## 2023-04-16 DIAGNOSIS — B079 Viral wart, unspecified: Secondary | ICD-10-CM | POA: Diagnosis not present

## 2023-05-13 DIAGNOSIS — E1169 Type 2 diabetes mellitus with other specified complication: Secondary | ICD-10-CM | POA: Diagnosis not present

## 2023-05-13 DIAGNOSIS — I10 Essential (primary) hypertension: Secondary | ICD-10-CM | POA: Diagnosis not present

## 2023-05-14 DIAGNOSIS — E1169 Type 2 diabetes mellitus with other specified complication: Secondary | ICD-10-CM | POA: Diagnosis not present

## 2023-05-14 DIAGNOSIS — I10 Essential (primary) hypertension: Secondary | ICD-10-CM | POA: Diagnosis not present

## 2023-05-23 DIAGNOSIS — I10 Essential (primary) hypertension: Secondary | ICD-10-CM | POA: Diagnosis not present

## 2023-05-23 DIAGNOSIS — E785 Hyperlipidemia, unspecified: Secondary | ICD-10-CM | POA: Diagnosis not present

## 2023-05-23 DIAGNOSIS — E1169 Type 2 diabetes mellitus with other specified complication: Secondary | ICD-10-CM | POA: Diagnosis not present

## 2023-05-23 DIAGNOSIS — Z6825 Body mass index (BMI) 25.0-25.9, adult: Secondary | ICD-10-CM | POA: Diagnosis not present

## 2023-06-09 DIAGNOSIS — E119 Type 2 diabetes mellitus without complications: Secondary | ICD-10-CM | POA: Diagnosis not present

## 2023-06-13 DIAGNOSIS — E1169 Type 2 diabetes mellitus with other specified complication: Secondary | ICD-10-CM | POA: Diagnosis not present

## 2023-06-13 DIAGNOSIS — I10 Essential (primary) hypertension: Secondary | ICD-10-CM | POA: Diagnosis not present

## 2023-06-14 DIAGNOSIS — I10 Essential (primary) hypertension: Secondary | ICD-10-CM | POA: Diagnosis not present

## 2023-06-14 DIAGNOSIS — E1169 Type 2 diabetes mellitus with other specified complication: Secondary | ICD-10-CM | POA: Diagnosis not present

## 2023-06-27 DIAGNOSIS — I10 Essential (primary) hypertension: Secondary | ICD-10-CM | POA: Diagnosis not present

## 2023-06-27 DIAGNOSIS — E1169 Type 2 diabetes mellitus with other specified complication: Secondary | ICD-10-CM | POA: Diagnosis not present

## 2023-07-11 DIAGNOSIS — E1169 Type 2 diabetes mellitus with other specified complication: Secondary | ICD-10-CM | POA: Diagnosis not present

## 2023-07-11 DIAGNOSIS — I10 Essential (primary) hypertension: Secondary | ICD-10-CM | POA: Diagnosis not present

## 2023-07-12 DIAGNOSIS — E1169 Type 2 diabetes mellitus with other specified complication: Secondary | ICD-10-CM | POA: Diagnosis not present

## 2023-07-12 DIAGNOSIS — I10 Essential (primary) hypertension: Secondary | ICD-10-CM | POA: Diagnosis not present

## 2023-07-14 DIAGNOSIS — I10 Essential (primary) hypertension: Secondary | ICD-10-CM | POA: Diagnosis not present

## 2023-07-14 DIAGNOSIS — R413 Other amnesia: Secondary | ICD-10-CM | POA: Diagnosis not present

## 2023-07-14 DIAGNOSIS — E1169 Type 2 diabetes mellitus with other specified complication: Secondary | ICD-10-CM | POA: Diagnosis not present

## 2023-07-14 DIAGNOSIS — I2510 Atherosclerotic heart disease of native coronary artery without angina pectoris: Secondary | ICD-10-CM | POA: Diagnosis not present

## 2023-07-14 DIAGNOSIS — E785 Hyperlipidemia, unspecified: Secondary | ICD-10-CM | POA: Diagnosis not present

## 2023-07-23 DIAGNOSIS — I10 Essential (primary) hypertension: Secondary | ICD-10-CM | POA: Diagnosis not present

## 2023-07-23 DIAGNOSIS — Z6824 Body mass index (BMI) 24.0-24.9, adult: Secondary | ICD-10-CM | POA: Diagnosis not present

## 2023-07-24 ENCOUNTER — Other Ambulatory Visit (HOSPITAL_BASED_OUTPATIENT_CLINIC_OR_DEPARTMENT_OTHER): Payer: Self-pay | Admitting: Family Medicine

## 2023-07-24 DIAGNOSIS — R4189 Other symptoms and signs involving cognitive functions and awareness: Secondary | ICD-10-CM

## 2023-08-01 ENCOUNTER — Ambulatory Visit (HOSPITAL_BASED_OUTPATIENT_CLINIC_OR_DEPARTMENT_OTHER)
Admission: RE | Admit: 2023-08-01 | Discharge: 2023-08-01 | Disposition: A | Discharge status: Home / Self Care | Source: Ambulatory Visit | Attending: Family Medicine | Admitting: Family Medicine

## 2023-08-01 DIAGNOSIS — I6523 Occlusion and stenosis of bilateral carotid arteries: Secondary | ICD-10-CM | POA: Diagnosis not present

## 2023-08-01 DIAGNOSIS — R9082 White matter disease, unspecified: Secondary | ICD-10-CM | POA: Diagnosis not present

## 2023-08-01 DIAGNOSIS — R4189 Other symptoms and signs involving cognitive functions and awareness: Secondary | ICD-10-CM | POA: Diagnosis not present

## 2023-08-14 DIAGNOSIS — I10 Essential (primary) hypertension: Secondary | ICD-10-CM | POA: Diagnosis not present

## 2023-08-14 DIAGNOSIS — Z6824 Body mass index (BMI) 24.0-24.9, adult: Secondary | ICD-10-CM | POA: Diagnosis not present

## 2023-08-14 DIAGNOSIS — G309 Alzheimer's disease, unspecified: Secondary | ICD-10-CM | POA: Diagnosis not present

## 2023-08-25 DIAGNOSIS — M549 Dorsalgia, unspecified: Secondary | ICD-10-CM | POA: Diagnosis not present

## 2023-08-25 DIAGNOSIS — Z6824 Body mass index (BMI) 24.0-24.9, adult: Secondary | ICD-10-CM | POA: Diagnosis not present

## 2023-09-11 DIAGNOSIS — I10 Essential (primary) hypertension: Secondary | ICD-10-CM | POA: Diagnosis not present

## 2023-09-11 DIAGNOSIS — E785 Hyperlipidemia, unspecified: Secondary | ICD-10-CM | POA: Diagnosis not present

## 2023-09-29 DIAGNOSIS — E119 Type 2 diabetes mellitus without complications: Secondary | ICD-10-CM | POA: Diagnosis not present

## 2023-09-29 DIAGNOSIS — H524 Presbyopia: Secondary | ICD-10-CM | POA: Diagnosis not present

## 2023-10-11 DIAGNOSIS — I10 Essential (primary) hypertension: Secondary | ICD-10-CM | POA: Diagnosis not present

## 2023-10-21 DIAGNOSIS — Z139 Encounter for screening, unspecified: Secondary | ICD-10-CM | POA: Diagnosis not present

## 2023-10-21 DIAGNOSIS — Z6823 Body mass index (BMI) 23.0-23.9, adult: Secondary | ICD-10-CM | POA: Diagnosis not present

## 2023-10-21 DIAGNOSIS — Z0000 Encounter for general adult medical examination without abnormal findings: Secondary | ICD-10-CM | POA: Diagnosis not present

## 2023-10-21 DIAGNOSIS — G309 Alzheimer's disease, unspecified: Secondary | ICD-10-CM | POA: Diagnosis not present

## 2023-11-03 ENCOUNTER — Other Ambulatory Visit (INDEPENDENT_AMBULATORY_CARE_PROVIDER_SITE_OTHER): Payer: Self-pay

## 2023-11-03 DIAGNOSIS — E1169 Type 2 diabetes mellitus with other specified complication: Secondary | ICD-10-CM | POA: Diagnosis not present

## 2023-11-03 DIAGNOSIS — I10 Essential (primary) hypertension: Secondary | ICD-10-CM | POA: Diagnosis not present

## 2023-11-03 DIAGNOSIS — Z0289 Encounter for other administrative examinations: Secondary | ICD-10-CM

## 2023-11-10 DIAGNOSIS — I10 Essential (primary) hypertension: Secondary | ICD-10-CM | POA: Diagnosis not present

## 2023-11-19 DIAGNOSIS — Z139 Encounter for screening, unspecified: Secondary | ICD-10-CM | POA: Diagnosis not present

## 2023-11-19 DIAGNOSIS — E1159 Type 2 diabetes mellitus with other circulatory complications: Secondary | ICD-10-CM | POA: Diagnosis not present

## 2023-11-19 DIAGNOSIS — E785 Hyperlipidemia, unspecified: Secondary | ICD-10-CM | POA: Diagnosis not present

## 2023-11-19 DIAGNOSIS — I152 Hypertension secondary to endocrine disorders: Secondary | ICD-10-CM | POA: Diagnosis not present

## 2023-11-19 DIAGNOSIS — E1169 Type 2 diabetes mellitus with other specified complication: Secondary | ICD-10-CM | POA: Diagnosis not present

## 2023-11-19 DIAGNOSIS — Z0000 Encounter for general adult medical examination without abnormal findings: Secondary | ICD-10-CM | POA: Diagnosis not present

## 2023-11-19 DIAGNOSIS — Z136 Encounter for screening for cardiovascular disorders: Secondary | ICD-10-CM | POA: Diagnosis not present

## 2023-11-19 DIAGNOSIS — Z6823 Body mass index (BMI) 23.0-23.9, adult: Secondary | ICD-10-CM | POA: Diagnosis not present

## 2023-11-19 DIAGNOSIS — Z1389 Encounter for screening for other disorder: Secondary | ICD-10-CM | POA: Diagnosis not present

## 2023-12-11 DIAGNOSIS — I10 Essential (primary) hypertension: Secondary | ICD-10-CM | POA: Diagnosis not present

## 2023-12-12 DIAGNOSIS — E785 Hyperlipidemia, unspecified: Secondary | ICD-10-CM | POA: Diagnosis not present

## 2023-12-12 DIAGNOSIS — I10 Essential (primary) hypertension: Secondary | ICD-10-CM | POA: Diagnosis not present

## 2023-12-16 ENCOUNTER — Encounter: Payer: Self-pay | Admitting: Pharmacist

## 2023-12-16 NOTE — Progress Notes (Signed)
 Pharmacy Quality Measure Review  This patient is appearing on a report for being at risk of failing the adherence measure for diabetes and hypertension (ACEi/ARB) medications this calendar year.   Medication: metformin  1000 mg  Last fill date: 12/02/2023 for 90 day supply  Medication: valsartan 320 mg  Last fill date: 11/22/2023 for 90 day supply  Insurance report was not up to date. No action needed at this time.  Provider responded to prior request to convert metformin  1000 mg PO BID from 30 ds to 90 ds.   Annabella Galla, PharmD Clinical Pharmacist [place] Direct Dial: [phone]

## 2023-12-24 DIAGNOSIS — Z6823 Body mass index (BMI) 23.0-23.9, adult: Secondary | ICD-10-CM | POA: Diagnosis not present

## 2023-12-24 DIAGNOSIS — M545 Low back pain, unspecified: Secondary | ICD-10-CM | POA: Diagnosis not present

## 2023-12-24 DIAGNOSIS — M5410 Radiculopathy, site unspecified: Secondary | ICD-10-CM | POA: Diagnosis not present

## 2023-12-25 ENCOUNTER — Ambulatory Visit (HOSPITAL_BASED_OUTPATIENT_CLINIC_OR_DEPARTMENT_OTHER)
Admission: RE | Admit: 2023-12-25 | Discharge: 2023-12-25 | Disposition: A | Discharge status: Home / Self Care | Source: Ambulatory Visit | Attending: Family Medicine | Admitting: Family Medicine

## 2023-12-25 ENCOUNTER — Other Ambulatory Visit (HOSPITAL_BASED_OUTPATIENT_CLINIC_OR_DEPARTMENT_OTHER): Payer: Self-pay | Admitting: Family Medicine

## 2023-12-25 ENCOUNTER — Ambulatory Visit: Admitting: Neurology

## 2023-12-25 ENCOUNTER — Encounter: Payer: Self-pay | Admitting: Neurology

## 2023-12-25 VITALS — BP 150/79 | HR 63 | Resp 15 | Ht 69.0 in | Wt 159.0 lb

## 2023-12-25 DIAGNOSIS — M5416 Radiculopathy, lumbar region: Secondary | ICD-10-CM | POA: Diagnosis not present

## 2023-12-25 DIAGNOSIS — M48061 Spinal stenosis, lumbar region without neurogenic claudication: Secondary | ICD-10-CM | POA: Diagnosis not present

## 2023-12-25 DIAGNOSIS — G309 Alzheimer's disease, unspecified: Secondary | ICD-10-CM | POA: Diagnosis not present

## 2023-12-25 DIAGNOSIS — M5116 Intervertebral disc disorders with radiculopathy, lumbar region: Secondary | ICD-10-CM | POA: Diagnosis not present

## 2023-12-25 DIAGNOSIS — G3184 Mild cognitive impairment, so stated: Secondary | ICD-10-CM | POA: Diagnosis not present

## 2023-12-25 NOTE — Patient Instructions (Signed)
 Continue current medications including Aricept  Continue to follow up with PCP  Return as needed     There are well-accepted and sensible ways to reduce risk for Alzheimers disease and other degenerative brain disorders .  Exercise Daily Walk A daily 20 minute walk should be part of your routine. Disease related apathy can be a significant roadblock to exercise and the only way to overcome this is to make it a daily routine and perhaps have a reward at the end (something your loved one loves to eat or drink perhaps) or a personal trainer coming to the home can also be very useful. Most importantly, the patient is much more likely to exercise if the caregiver / spouse does it with him/her. In general a structured, repetitive schedule is best.  General Health: Any diseases which effect your body will effect your brain such as a pneumonia, urinary infection, blood clot, heart attack or stroke. Keep contact with your primary care doctor for regular follow ups.  Sleep. A good nights sleep is healthy for the brain. Seven hours is recommended. If you have insomnia or poor sleep habits we can give you some instructions. If you have sleep apnea wear your mask.  Diet: Eating a heart healthy diet is also a good idea; fish and poultry instead of red meat, nuts (mostly non-peanuts), vegetables, fruits, olive oil or canola oil (instead of butter), minimal salt (use other spices to flavor foods), whole grain rice, bread, cereal and pasta and wine in moderation.Research is now showing that the MIND diet, which is a combination of The Mediterranean diet and the DASH diet, is beneficial for cognitive processing and longevity. Information about this diet can be found in The MIND Diet, a book by Annitta Feeling, MS, RDN, and online at [employer]Wild[employer].es  Finances, Power of [address] and Advance Directives: You should consider putting legal safeguards in place with regard to financial and medical  decision making. While the spouse always has power of attorney for medical and financial issues in the absence of any form, you should consider what you want in case the spouse / caregiver is no longer around or capable of making decisions.

## 2023-12-25 NOTE — Progress Notes (Signed)
 [facility]  PATIENT: Wayne Marshall DOB: 06/12/1950  REQUESTING CLINICIAN: Trinidad Glisson, MD HISTORY FROM: Patient/Spouse  REASON FOR VISIT: Memory loss    HISTORICAL  CHIEF COMPLAINT:  Chief Complaint  Patient presents with   New Patient (Initial Visit)    Rm12, wife present, New patient referral from [facility]/Francisco Trinidad MD [phone]/Alzheimer's dementia: moca score of 16, pt seemed to have aphasia when flustered and the inability to articulate words     HISTORY OF PRESENT ILLNESS:  This is a 73 year old gentleman past medical history hypertension, hyperlipidemia, diabetes mellitus, heart disease, 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  He is accompanied by wife who provides some history.  Patient memory loss has been going on for the past year and getting worse.  Memory loss described as being forgetful, mainly short-term memory loss.  Wife tells me that he is also repetitive, does tell the same story like is the first time, asks the same questions.  Wife also feels like sometimes she has to repeat herself.  He is still independent in all his ADLs.  He has seen his PCP, has workup completed already.  His ATN was positive for AD biomarkers, CT head completed, no acute abnormality.  Patient was started on donepezil.  He report compliance with the medication.   TBI:   No past history of TBI Stroke: Yes, 2012  Seizures:   no past history of seizures Sleep:  no history of sleep apnea.   Mood: patient denies anxiety and depression Family history of Dementia: Sister with dementia aged, 40 and her mother  Functional status: independent in all ADLs and IADLs Patient lives spouse. Cooking: no issues Cleaning: no issues Shopping: no issues  Bathing: no issues Toileting: no issues  Driving: no issues  Bills: no issues  Medications: Patient  Ever left the stove on by  accident?: Denies Forget how to use items around the house?: Denies Getting lost going to familiar places?: Denies Forgetting loved ones names?: denies Word finding difficulty? Yes Sleep: good    OTHER MEDICAL CONDITIONS: Hypertension, Hyperlipidemia, Diabetes, CAD   REVIEW OF SYSTEMS: Full 14 system review of systems performed and negative with exception of: As noted in the HPI   ALLERGIES: No Known Allergies  HOME MEDICATIONS: Outpatient Medications Prior to Visit  Medication Sig Dispense Refill   atorvastatin (LIPITOR) 80 MG tablet Take 1 tablet (80 mg total) by mouth daily. 90 tablet 1   carvedilol (COREG) 6.25 MG tablet Take 6.25 mg by mouth 3 (three) times daily.     FARXIGA 10 MG TABS tablet Take 10 mg by mouth daily.     hydrALAZINE (APRESOLINE) 25 MG tablet Take 25 mg by mouth 3 (three) times daily.     metFORMIN (GLUCOPHAGE) 1000 MG tablet Take 1,000 mg by mouth 2 (two) times daily with a meal.     Multiple Vitamin (MULTIVITAMIN) capsule Take 1 capsule by mouth daily.     nitroGLYCERIN (NITROSTAT) 0.4 MG SL tablet Place 0.4 mg under the tongue every 5 (five) minutes as needed for chest pain.     spironolactone (ALDACTONE) 25 MG tablet Take 25 mg by mouth daily.     valsartan (DIOVAN) 320 MG tablet Take 320 mg by mouth daily.     clopidogrel (PLAVIX) 75 MG tablet Take 1 tablet (75 mg total) by mouth daily. 90 tablet 3   fluticasone (FLONASE) 50 MCG/ACT nasal spray Place  1 spray into both nostrils daily.     Misc Natural Products (URINOZINC PROSTATE) CAPS Take 1 capsule by mouth daily.     Psyllium (METAMUCIL) 0.36 g CAPS Take 0.36 g by mouth at bedtime.     No facility-administered medications prior to visit.    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   Contusion of right thumb 08/27/2021   COVID-19 2021   Diabetes mellitus without complication (HCC)    DM2 (diabetes mellitus, type 2) (HCC) 06/30/2019   Heart murmur    trace-mild AI 03/14/21 echo   HTN (hypertension) 06/30/2019   Hyperlipidemia    Hyperlipidemia associated with type 2 diabetes mellitus (HCC)    Hypertension    Hypoxemia    Impingement syndrome of left shoulder region 11/12/2017   Injury of ligament of hand 08/27/2021   Mixed dyslipidemia 12/01/2020   Pneumonia due to COVID-19 virus 06/30/2019   STEMI (ST elevation myocardial infarction) (HCC) 06/19/2020   STEMI involving right coronary artery (HCC) 06/19/2020   Stroke (HCC) 04/19/2011    PAST SURGICAL HISTORY: Past Surgical History:  Procedure Laterality Date   CORONARY STENT INTERVENTION N/A 06/19/2020   Procedure: CORONARY STENT INTERVENTION;  Surgeon: Mady Bruckner, MD;  Location: [facility];  Service: Cardiovascular;  Laterality: N/A;   CORONARY STENT INTERVENTION N/A 06/21/2020   Procedure: CORONARY STENT INTERVENTION;  Surgeon: Darron Deatrice LABOR, MD;  Location: [facility]VAS[facility] CV [facility];  Service: Cardiovascular;  Laterality: N/A;   CORONARY ULTRASOUND/IVUS N/A 06/21/2020   Procedure: Intravascular Ultrasound/IVUS;  Surgeon: Darron Deatrice LABOR, MD;  Location: [facility]AS[facility];  Service: Cardiovascular;  Laterality: N/A;   LEFT HEART CATH AND CORONARY ANGIOGRAPHY N/A 06/19/2020   Procedure: LEFT HEART CATH AND CORONARY ANGIOGRAPHY;  Surgeon: Mady Bruckner, MD;  Location: [facility];  Service: Cardiovascular;  Laterality: N/A;   SHOULDER ARTHROSCOPY Left    Dr Melita    FAMILY HISTORY: Family History  Problem Relation Age of Onset   Cancer Father    Heart disease Neg Hx     SOCIAL HISTORY: Social History   Socioeconomic History   Marital status: Married    Spouse name: margie Mikhail   Number of children: Not on file   Years  of education: Not on file   Highest education level: Not on file  Occupational History   Not on file  Tobacco Use   Smoking status: Never   Smokeless tobacco: Never  Vaping Use   Vaping status: Never Used  Substance and Sexual Activity   Alcohol use: Never   Drug use: Not Currently    Types: Marijuana    Comment: in college   Sexual activity: Never  Other Topics Concern   Not on file  Social History Narrative   Not on file   Social Drivers of Health   Financial Resource Strain: Not on file  Food Insecurity: Not on file  Transportation Needs: Not on file  Physical Activity: Not on file  Stress: Not on file  Social Connections: Not on file  Intimate Partner Violence: Not on file    PHYSICAL EXAM  GENERAL EXAM/CONSTITUTIONAL: Vitals:  Vitals:   12/25/23 1001 12/25/23 1019  BP: (!) 181/86 ROLLEN)  150/79  Pulse: 63   Resp: 15   SpO2: 97%   Weight: 159 lb (72.1 kg)   Height: 5' 9 (1.753 m)    Body mass index is 23.48 kg/m. Wt Readings from Last 3 Encounters:  12/25/23 159 lb (72.1 kg)  09/13/22 162 lb (73.5 kg)  08/09/22 164 lb 3.2 oz (74.5 kg)   Patient is in no distress; well developed, nourished and groomed; neck is supple  MUSCULOSKELETAL: Gait, strength, tone, movements noted in Neurologic exam below  NEUROLOGIC: MENTAL STATUS:      No data to display            12/25/2023   10:14 AM  Montreal Cognitive Assessment   Visuospatial/ Executive (0/5) 1  Naming (0/3) 3  Attention: Read list of digits (0/2) 1  Attention: Read list of letters (0/1) 0  Attention: Serial 7 subtraction starting at 100 (0/3) 2  Language: Repeat phrase (0/2) 1  Language : Fluency (0/1) 1  Abstraction (0/2) 2  Delayed Recall (0/5) 0  Orientation (0/6) 5  Total 16    awake, alert, oriented to person, place and time language fluent, comprehension intact, naming intact fund of knowledge appropriate  CRANIAL NERVE:  2nd, 3rd, 4th, 6th- visual fields full to  confrontation, extraocular muscles intact, no nystagmus 5th - facial sensation symmetric 7th - facial strength symmetric 8th - hearing intact 9th - palate elevates symmetrically, uvula midline 11th - shoulder shrug symmetric 12th - tongue protrusion midline  MOTOR:  normal bulk and tone, full strength in the BUE, BLE  SENSORY:  normal and symmetric to light touch  COORDINATION:  finger-nose-finger, fine finger movements normal  GAIT/STATION:  normal   DIAGNOSTIC DATA (LABS, IMAGING, TESTING) - I reviewed patient records, labs, notes, testing and imaging myself where available.  Lab Results  Component Value Date   WBC 9.1 09/13/2022   HGB 13.1 09/13/2022   HCT 38.8 (L) 09/13/2022   MCV 90.0 09/13/2022   PLT 254 09/13/2022      Component Value Date/Time   NA 139 09/13/2022 1402   NA 136 11/30/2020 0910   K 3.9 09/13/2022 1402   CL 109 09/13/2022 1402   CO2 23 09/13/2022 1402   GLUCOSE 122 (H) 09/13/2022 1402   BUN 24 (H) 09/13/2022 1402   BUN 21 11/30/2020 0910   CREATININE 1.25 (H) 09/13/2022 1402   CALCIUM 8.8 (L) 09/13/2022 1402   PROT 6.9 11/30/2020 0910   ALBUMIN 4.3 11/30/2020 0910   AST 17 11/30/2020 0910   ALT 15 11/30/2020 0910   ALKPHOS 92 11/30/2020 0910   BILITOT 0.7 11/30/2020 0910   GFRNONAA >60 09/13/2022 1402   GFRAA 52 (L) 07/05/2020 0842   Lab Results  Component Value Date   CHOL 145 11/30/2020   HDL 45 11/30/2020   LDLCALC 88 11/30/2020   TRIG 60 11/30/2020   CHOLHDL 3.2 11/30/2020   Lab Results  Component Value Date   HGBA1C 6.2 (H) 06/19/2020   No results found for: [id number] Lab Results  Component Value Date   TSH 1.760 11/30/2020  Recent labs  TSH  2.280 B12  432 ATN profile positive for Alzheimer disease biomarkers.    Head CT 08/14/2023 1. Progressive diffuse white matter disease and volume loss. This likely reflects the sequela of chronic microvascular ischemia. 2. No acute intracranial abnormality or significant  interval change.    ASSESSMENT AND PLAN  73 y.o. year old male with history of hypertension, hyperlipidemia, heart disease,  cognitive  impairment who is presenting for further evaluation of the memory loss.  His workup is already completed, ATN profile positive for Alzheimer disease and he was already started on Aricept. Advised him to continue with Aricept nightly.  At the moment no further workup indicated.  We discussed new monoclonal antibodies including Kisunla and Leqembi, gave him information about Celedonio but for now he would like to stay with the tablets (Aricept).  Plan will be for patient to continue with PCP, and to return as needed.   1. Mild cognitive impairment (MCI) due to Alzheimer's disease [facility])      Patient Instructions  Continue current medications including Aricept  Continue to follow up with PCP  Return as needed     There are well-accepted and sensible ways to reduce risk for Alzheimers disease and other degenerative brain disorders .  Exercise Daily Walk A daily 20 minute walk should be part of your routine. Disease related apathy can be a significant roadblock to exercise and the only way to overcome this is to make it a daily routine and perhaps have a reward at the end (something your loved one loves to eat or drink perhaps) or a personal trainer coming to the home can also be very useful. Most importantly, the patient is much more likely to exercise if the caregiver / spouse does it with him/her. In general a structured, repetitive schedule is best.  General Health: Any diseases which effect your body will effect your brain such as a pneumonia, urinary infection, blood clot, heart attack or stroke. Keep contact with your primary care doctor for regular follow ups.  Sleep. A good nights sleep is healthy for the brain. Seven hours is recommended. If you have insomnia or poor sleep habits we can give you some instructions. If you have sleep apnea wear your  mask.  Diet: Eating a heart healthy diet is also a good idea; fish and poultry instead of red meat, nuts (mostly non-peanuts), vegetables, fruits, olive oil or canola oil (instead of butter), minimal salt (use other spices to flavor foods), whole grain rice, bread, cereal and pasta and wine in moderation.Research is now showing that the MIND diet, which is a combination of The Mediterranean diet and the DASH diet, is beneficial for cognitive processing and longevity. Information about this diet can be found in The MIND Diet, a book by Annitta Feeling, MS, RDN, and online at [employer]  Finances, Power of [address] and Advance Directives: You should consider putting legal safeguards in place with regard to financial and medical decision making. While the spouse always has power of attorney for medical and financial issues in the absence of any form, you should consider what you want in case the spouse / caregiver is no longer around or capable of making decisions.     No orders of the defined types were placed in this encounter.   No orders of the defined types were placed in this encounter.   Return if symptoms worsen or fail to improve.    Pastor Falling, MD 12/25/2023, 5:03 PM  [facility] [address] [place], [place] [address] [phone]

## 2024-01-11 DIAGNOSIS — I10 Essential (primary) hypertension: Secondary | ICD-10-CM | POA: Diagnosis not present

## 2024-01-12 DIAGNOSIS — I10 Essential (primary) hypertension: Secondary | ICD-10-CM | POA: Diagnosis not present

## 2024-01-12 DIAGNOSIS — E785 Hyperlipidemia, unspecified: Secondary | ICD-10-CM | POA: Diagnosis not present

## 2024-01-24 DIAGNOSIS — I252 Old myocardial infarction: Secondary | ICD-10-CM | POA: Diagnosis not present

## 2024-01-24 DIAGNOSIS — E119 Type 2 diabetes mellitus without complications: Secondary | ICD-10-CM | POA: Diagnosis not present

## 2024-01-24 DIAGNOSIS — M109 Gout, unspecified: Secondary | ICD-10-CM | POA: Diagnosis not present

## 2024-01-24 DIAGNOSIS — I10 Essential (primary) hypertension: Secondary | ICD-10-CM | POA: Diagnosis not present

## 2024-01-24 DIAGNOSIS — M1990 Unspecified osteoarthritis, unspecified site: Secondary | ICD-10-CM | POA: Diagnosis not present

## 2024-01-24 DIAGNOSIS — Z79899 Other long term (current) drug therapy: Secondary | ICD-10-CM | POA: Diagnosis not present

## 2024-01-24 DIAGNOSIS — R55 Syncope and collapse: Secondary | ICD-10-CM | POA: Diagnosis not present

## 2024-01-24 DIAGNOSIS — R404 Transient alteration of awareness: Secondary | ICD-10-CM | POA: Diagnosis not present

## 2024-01-24 DIAGNOSIS — E7800 Pure hypercholesterolemia, unspecified: Secondary | ICD-10-CM | POA: Diagnosis not present

## 2024-01-24 DIAGNOSIS — R001 Bradycardia, unspecified: Secondary | ICD-10-CM | POA: Diagnosis not present

## 2024-01-24 DIAGNOSIS — Z743 Need for continuous supervision: Secondary | ICD-10-CM | POA: Diagnosis not present

## 2024-01-24 DIAGNOSIS — K219 Gastro-esophageal reflux disease without esophagitis: Secondary | ICD-10-CM | POA: Diagnosis not present

## 2024-01-24 DIAGNOSIS — Z8673 Personal history of transient ischemic attack (TIA), and cerebral infarction without residual deficits: Secondary | ICD-10-CM | POA: Diagnosis not present

## 2024-01-24 DIAGNOSIS — R42 Dizziness and giddiness: Secondary | ICD-10-CM | POA: Diagnosis not present

## 2024-01-28 DIAGNOSIS — E785 Hyperlipidemia, unspecified: Secondary | ICD-10-CM | POA: Diagnosis not present

## 2024-01-28 DIAGNOSIS — Z23 Encounter for immunization: Secondary | ICD-10-CM | POA: Diagnosis not present

## 2024-01-28 DIAGNOSIS — E1169 Type 2 diabetes mellitus with other specified complication: Secondary | ICD-10-CM | POA: Diagnosis not present

## 2024-01-28 DIAGNOSIS — Z6823 Body mass index (BMI) 23.0-23.9, adult: Secondary | ICD-10-CM | POA: Diagnosis not present

## 2024-01-28 DIAGNOSIS — I10 Essential (primary) hypertension: Secondary | ICD-10-CM | POA: Diagnosis not present

## 2024-02-10 DIAGNOSIS — I10 Essential (primary) hypertension: Secondary | ICD-10-CM | POA: Diagnosis not present

## 2024-02-16 DIAGNOSIS — M545 Low back pain, unspecified: Secondary | ICD-10-CM | POA: Diagnosis not present

## 2024-02-16 DIAGNOSIS — M51369 Other intervertebral disc degeneration, lumbar region without mention of lumbar back pain or lower extremity pain: Secondary | ICD-10-CM | POA: Diagnosis not present

## 2024-02-16 DIAGNOSIS — R197 Diarrhea, unspecified: Secondary | ICD-10-CM | POA: Diagnosis not present

## 2024-02-16 DIAGNOSIS — J310 Chronic rhinitis: Secondary | ICD-10-CM | POA: Diagnosis not present

## 2024-02-16 DIAGNOSIS — Z6823 Body mass index (BMI) 23.0-23.9, adult: Secondary | ICD-10-CM | POA: Diagnosis not present

## 2024-02-19 DIAGNOSIS — Z6823 Body mass index (BMI) 23.0-23.9, adult: Secondary | ICD-10-CM | POA: Diagnosis not present

## 2024-02-19 DIAGNOSIS — J069 Acute upper respiratory infection, unspecified: Secondary | ICD-10-CM | POA: Diagnosis not present
